# Patient Record
Sex: Male | Born: 1960 | Race: White | Hispanic: No | Marital: Married | Smoking: Never smoker
Health system: Southern US, Community
[De-identification: ages and names within clinical notes are randomized; demographics above are authoritative.]

## PROBLEM LIST (undated history)

## (undated) DIAGNOSIS — M1990 Unspecified osteoarthritis, unspecified site: Secondary | ICD-10-CM

## (undated) DIAGNOSIS — R011 Cardiac murmur, unspecified: Secondary | ICD-10-CM

## (undated) DIAGNOSIS — I10 Essential (primary) hypertension: Secondary | ICD-10-CM

## (undated) HISTORY — DX: Cardiac murmur, unspecified: R01.1

## (undated) HISTORY — PX: APPENDECTOMY: SHX54

## (undated) HISTORY — DX: Unspecified osteoarthritis, unspecified site: M19.90

## (undated) HISTORY — PX: TONSILLECTOMY: SUR1361

## (undated) HISTORY — DX: Essential (primary) hypertension: I10

---

## 2004-08-03 ENCOUNTER — Emergency Department (HOSPITAL_COMMUNITY): Admission: EM | Admit: 2004-08-03 | Discharge: 2004-08-03 | Payer: Self-pay | Admitting: Family Medicine

## 2014-03-11 ENCOUNTER — Ambulatory Visit (INDEPENDENT_AMBULATORY_CARE_PROVIDER_SITE_OTHER): Payer: Managed Care, Other (non HMO) | Admitting: Sports Medicine

## 2014-03-11 ENCOUNTER — Ambulatory Visit (INDEPENDENT_AMBULATORY_CARE_PROVIDER_SITE_OTHER): Payer: Managed Care, Other (non HMO)

## 2014-03-11 VITALS — BP 138/82 | HR 79 | Temp 98.4°F | Resp 16 | Ht 74.5 in | Wt 251.0 lb

## 2014-03-11 DIAGNOSIS — M545 Low back pain, unspecified: Secondary | ICD-10-CM

## 2014-03-11 MED ORDER — TRAMADOL HCL 50 MG PO TABS: 50.0000 mg | ORAL_TABLET | Freq: Two times a day (BID) | ORAL | Status: DC | PRN

## 2014-03-11 MED ORDER — CYCLOBENZAPRINE HCL 5 MG PO TABS: 5.0000 mg | ORAL_TABLET | Freq: Every evening | ORAL | Status: DC | PRN

## 2014-03-11 MED ORDER — PREDNISONE 10 MG PO TABS: ORAL_TABLET | ORAL | Status: DC

## 2014-03-11 NOTE — Progress Notes (Signed)
Gabriel Carter - 54 y.o. male MRN [id number]  Date of birth: [birth year]  SUBJECTIVE:  Including CC & ROS.  Patient is a 54 year old gentleman presented today with low back pain past 2448 hrs. Patient denies any specific injury or trauma to his back. He reports that yesterday he was bending down to pick something off nothing particularly heavy he found a twinge in his back. That night he developed some severe back pain making it difficult to sleep. Describes a generalized soreness and muscle spasm across the back. Occasional sharp twinges with specific motions. No complaints of numbness or tingling in lower extremity, no pain in the groin or lateral hips. No changes in bladder or bowel function. Patient attempted to treat his pain with over-the-counter medication such as Tylenol and a multi drug arthritis medication over-the-counter. Patient had no significant relief from this. Did not try any heat ice or stretching.*   ROS:  Constitutional:  No fever, chills, or fatigue.  Respiratory:  No shortness of breath, cough, or wheezing Cardiovascular:  No palpitations, chest pain or syncope Gastrointestinal:  No nausea, no abdominal pain Review of systems otherwise negative except for what is stated in HPI  HISTORY: Past Medical, Surgical, Social, and Family History Reviewed & Updated per EMR. 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  PHYSICAL EXAM:  VS: BP:138/82 mmHg  HR:79bpm  TEMP:98.4 F (36.9 C)(Oral)  RESP:97 %  HT:6' 2.5" (189.2 cm)   WT:251 lb (113.853 kg)  BMI:31.9 LOW BACK EXAM: General: well nourished Skin of LE: warm; dry, no rashes, lesions, ecchymosis or erythema. Vascular: radial pulses 2+ bilaterally Neurologically: Sensation to light touch lower extremities  equal and intact bilaterally.  Observation: Normal curvature and no kyphosis or lordosis, no scoliosis.  Iliac crests are symmetric, shoulders line symmetrically Palpation:  No step off defects noted in the thoracic or lumbar spine.   No muscle spasm or tenderness along the paraspinal musculature of the thoracic Mild muscle spasm and tenderness along the paraspinal musculature of the lumbar spine. Range of motion:  Normal flexion on forward bending, no pain with extension, no pain with one leg hyperextension. Neuromuscular: No pain with straight leg raise left or right, normal gait walking with walking on heels or walking on toes, normal tandem gait.  Nerve root intervention  L2 and L3: Normal hip flexion with no weakness. L2, L3, L4: Normal hip abduction bilaterally.  Normal patellar DTR +3 bilaterally L4, L5, S1: Normal hip abduction bilaterally S1 and S2: Normal ankle plantar-flexion bilaterally.  Normal Achilles tendon DTR +3 bilaterally L5: Normal extensor hallucis longus bilaterally   [facility] reading (PRIMARY) by Dr. Tammy Sours 3 view of the lumbar spine: Mild degenerative changes, no increased lordosis, no listhesis, normal disc height and space   ASSESSMENT & PLAN:  Impression: Bilateral low back pain without sciatica likely related to muscle spasm  Recommendations: Advised patient that based on his clinical history and examination today  suspect majority of symptoms are related to either mild arthritis in his back versus lower lumbar muscle spasm. Will evaluate patient with x-rays today that was unremarkable for any significant degenerative changes or DDD. Recommended that based on his acute duration of symptoms and lack of symptoms of sciatica I suspect we can treat this with anti-inflammatories. Provided patient prescription with prednisone taper, short course of muscle  relaxer season night for rest, and a short course of pain medication if needed for severe pain tramadol  q12hrs 15  tablets. Patient verbalized understanding of this plan.

## 2018-12-16 ENCOUNTER — Other Ambulatory Visit: Payer: Self-pay | Admitting: Family Medicine

## 2018-12-16 ENCOUNTER — Ambulatory Visit
Admission: RE | Admit: 2018-12-16 | Discharge: 2018-12-16 | Disposition: A | Discharge status: Home / Self Care | Payer: No Typology Code available for payment source | Source: Ambulatory Visit | Attending: Family Medicine | Admitting: Family Medicine

## 2018-12-16 DIAGNOSIS — R0600 Dyspnea, unspecified: Secondary | ICD-10-CM

## 2018-12-24 ENCOUNTER — Encounter: Payer: Self-pay | Admitting: Cardiology

## 2018-12-24 ENCOUNTER — Ambulatory Visit: Payer: No Typology Code available for payment source | Admitting: Cardiology

## 2018-12-24 ENCOUNTER — Other Ambulatory Visit: Payer: Self-pay

## 2018-12-24 VITALS — BP 169/97 | HR 73 | Temp 98.7°F | Ht 75.0 in | Wt 265.6 lb

## 2018-12-24 DIAGNOSIS — Z8249 Family history of ischemic heart disease and other diseases of the circulatory system: Secondary | ICD-10-CM | POA: Diagnosis not present

## 2018-12-24 DIAGNOSIS — Z131 Encounter for screening for diabetes mellitus: Secondary | ICD-10-CM | POA: Diagnosis not present

## 2018-12-24 DIAGNOSIS — Z13220 Encounter for screening for lipoid disorders: Secondary | ICD-10-CM

## 2018-12-24 DIAGNOSIS — I447 Left bundle-branch block, unspecified: Secondary | ICD-10-CM

## 2018-12-24 DIAGNOSIS — R600 Localized edema: Secondary | ICD-10-CM | POA: Diagnosis not present

## 2018-12-24 DIAGNOSIS — R0609 Other forms of dyspnea: Secondary | ICD-10-CM

## 2018-12-24 DIAGNOSIS — R0600 Dyspnea, unspecified: Secondary | ICD-10-CM | POA: Insufficient documentation

## 2018-12-24 DIAGNOSIS — R030 Elevated blood-pressure reading, without diagnosis of hypertension: Secondary | ICD-10-CM | POA: Insufficient documentation

## 2018-12-24 MED ORDER — SPIRONOLACTONE 25 MG PO TABS: 25.0000 mg | ORAL_TABLET | Freq: Every day | ORAL | 3 refills | Status: DC

## 2018-12-24 NOTE — Progress Notes (Signed)
Patient referred by [name], L.Marlou Sa, MD for exertional dyspnea  Subjective:   Gabriel Carter, male    DOB: 1960-10-23, 58 y.o.   MRN: [id number]   Chief Complaint  Patient presents with  . New Patient (Initial Visit)  . Shortness of Breath  . Abnormal ECG     HPI  58 y.o. 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 He stays busy with work, but does not do any regular physical exercise. For last few months, he has noticed exertional dyspnea with climbing up a flight or two of stairs. He also endorses occasional episodes of orthopnea and leg edema. He denies any chest pain. Blood pressure is elevated today. He reports that BP always reads higher at doctors visits, but runs 130s/80s at home.   He has family h/o early CAD. His father had first heart attack in his 14s. He denies any family h/o congestive heart failure.    Past Medical History:  Diagnosis Date  . Arthritis   . Heart murmur      Past Surgical History:  Procedure Laterality Date  . APPENDECTOMY       Social History   Tobacco Use  Smoking Status Never Smoker  Smokeless Tobacco Never Used    Social History   Substance and Sexual Activity  Alcohol Use No  . Alcohol/week: 0.0 standard drinks     Family History  Problem Relation Age of Onset  . Hypertension Mother   . Diabetes Father   . Heart disease Father   . Cancer Maternal Grandmother   . Hypertension Maternal Grandmother   . Heart disease Maternal Grandfather   . Stroke Maternal Grandfather   . Hypertension Paternal Grandmother   . Cancer Paternal Grandfather      Current Outpatient Medications on File Prior to Visit  Medication Sig Dispense Refill  . aspirin EC 81 MG tablet Take 81 mg by mouth. 1 q 3days    . montelukast (SINGULAIR) 10 MG tablet Take 10 mg by mouth daily as needed.     No current facility-administered medications on file prior to visit.    Cardiovascular and other  pertinent studies:  EKG 12/24/2018: Sinus rhythm 69 bpm. Left bundle branch block.  Left atrial enlargement.    Recent labs: 12/16/2018: Glucose 111, BUN/Cr 13/0.95. EGFR 81. Na/K 139/4.1. Rest of the CMP normal H/H 15/43. MCV 95. Platelets 249    Review of Systems  Constitution: Negative for decreased appetite, malaise/fatigue, weight gain and weight loss.  HENT: Negative for congestion.   Eyes: Negative for visual disturbance.  Cardiovascular: Positive for dyspnea on exertion and leg swelling. Negative for chest pain, palpitations and syncope.  Respiratory: Negative for cough.   Endocrine: Negative for cold intolerance.  Hematologic/Lymphatic: Does not bruise/bleed easily.  Skin: Negative for itching and rash.  Musculoskeletal: Negative for myalgias.  Gastrointestinal: Negative for abdominal pain, nausea and vomiting.  Genitourinary: Negative for dysuria.  Neurological: Negative for dizziness and weakness.  Psychiatric/Behavioral: The patient is not nervous/anxious.   All other systems reviewed and are negative.        Vitals:   12/24/18 1043 12/24/18 1054  BP: (!) 164/93 (!) 169/97  Pulse: 69 73  Temp: 98.7 F (37.1 C)   SpO2: 98% 98%     Body mass index is 33.2 kg/m. Filed Weights   12/24/18 1043  Weight: 265 lb 9.6 oz (120.5 kg)     Objective:   Physical Exam  Constitutional: He  is oriented to person, place, and time. He appears well-developed and well-nourished. No distress.  HENT:  Head: Normocephalic and atraumatic.  Eyes: Pupils are equal, round, and reactive to light. Conjunctivae are normal.  Neck: No JVD present.  Cardiovascular: Normal rate, regular rhythm and intact distal pulses.  No murmur heard. Pulmonary/Chest: Effort normal and breath sounds normal. He has no wheezes. He has no rales.  Abdominal: Soft. Bowel sounds are normal. There is no rebound.  Musculoskeletal:        General: Edema (2+ b/l) present.  Lymphadenopathy:    He has  no cervical adenopathy.  Neurological: He is alert and oriented to person, place, and time. No cranial nerve deficit.  Skin: Skin is warm and dry.  Psychiatric: He has a normal mood and affect.  Nursing note and vitals reviewed.       Assessment & Recommendations:   58 y.o. Caucasian male with exertional dyspnea, LBBB, elevated blood pressure, family h/o early CAD.   Exertional dyspnea, leg edema: EKG shows LBBB. History positive for exertional dyspnea, orthopnea. Physical exam positive for leg edema. Suspect he may have systolic/diastolic heart failure. Other differentials include CAD, undiagnosed hypertension. Risk factors for CAD include family h/o early CAD. Will obtain echocardiogram and lexiscan nuclear stress test. Started spironolactone 25 mg daily, both for leg edema and elevated blood pressure. Will check BMP, BNP, TSH, A1C in 1 week.   Elevated blood pressure reading without diagnosis of hypertension Started spironolactone. Monitor blood pressure.     Thank you for referring the patient to [place]. Please feel free to contact with any questions.  Nigel Mormon, MD [facility] Pager: [phone] Office: [phone]

## 2019-01-01 ENCOUNTER — Other Ambulatory Visit: Payer: Self-pay

## 2019-01-01 ENCOUNTER — Ambulatory Visit (INDEPENDENT_AMBULATORY_CARE_PROVIDER_SITE_OTHER): Payer: No Typology Code available for payment source

## 2019-01-01 DIAGNOSIS — R0609 Other forms of dyspnea: Secondary | ICD-10-CM

## 2019-01-01 DIAGNOSIS — Z8249 Family history of ischemic heart disease and other diseases of the circulatory system: Secondary | ICD-10-CM | POA: Diagnosis not present

## 2019-01-01 DIAGNOSIS — R0600 Dyspnea, unspecified: Secondary | ICD-10-CM

## 2019-01-07 LAB — TSH: TSH: 0.936 u[IU]/mL (ref 0.450–4.500)

## 2019-01-07 LAB — BRAIN NATRIURETIC PEPTIDE: BNP: 20.6 pg/mL (ref 0.0–100.0)

## 2019-01-07 LAB — HEMOGLOBIN A1C
Est. average glucose Bld gHb Est-mCnc: 128 mg/dL
Hgb A1c MFr Bld: 6.1 % — ABNORMAL HIGH (ref 4.8–5.6)

## 2019-01-08 ENCOUNTER — Other Ambulatory Visit: Payer: Self-pay

## 2019-01-08 ENCOUNTER — Ambulatory Visit (INDEPENDENT_AMBULATORY_CARE_PROVIDER_SITE_OTHER): Payer: No Typology Code available for payment source

## 2019-01-08 DIAGNOSIS — R0609 Other forms of dyspnea: Secondary | ICD-10-CM | POA: Diagnosis not present

## 2019-01-08 DIAGNOSIS — R0600 Dyspnea, unspecified: Secondary | ICD-10-CM

## 2019-01-16 ENCOUNTER — Other Ambulatory Visit: Payer: Self-pay

## 2019-01-16 ENCOUNTER — Encounter: Payer: Self-pay | Admitting: Cardiology

## 2019-01-16 ENCOUNTER — Ambulatory Visit (INDEPENDENT_AMBULATORY_CARE_PROVIDER_SITE_OTHER): Payer: No Typology Code available for payment source | Admitting: Cardiology

## 2019-01-16 VITALS — BP 154/92 | HR 96 | Temp 98.5°F | Resp 14 | Ht 75.0 in | Wt 263.2 lb

## 2019-01-16 DIAGNOSIS — I5020 Unspecified systolic (congestive) heart failure: Secondary | ICD-10-CM | POA: Diagnosis not present

## 2019-01-16 DIAGNOSIS — I447 Left bundle-branch block, unspecified: Secondary | ICD-10-CM

## 2019-01-16 DIAGNOSIS — I10 Essential (primary) hypertension: Secondary | ICD-10-CM | POA: Diagnosis not present

## 2019-01-16 MED ORDER — ENTRESTO 49-51 MG PO TABS: 1.0000 | ORAL_TABLET | Freq: Two times a day (BID) | ORAL | 2 refills | Status: DC

## 2019-01-16 NOTE — Progress Notes (Signed)
Patient referred by [name], L.Marlou Sa, MD for exertional dyspnea  Subjective:   Gabriel Carter, male    DOB: 1960-02-20, 59 y.o.   MRN: [id number]   Chief Complaint  Patient presents with  . Results    Echocardiogram, Stress, Labs     HPI  59 y.o. Caucasian male with exertional dyspnea, LBBB, elevated blood pressure, family h/o early CAD.   Workup showed mod LVH, apical thinning dyskinesia concerning for old infarct, EF 20-25%. Lexiscan stress test showed  stress LVEF 33%. SPECT imaging showed small sized, mild intensity, mildly reversible, apical perfusion defect. High risk study.   Patient continues to have exertional dyspnea and bilateral leg swelling. He denies any chest pain. Blood pressure remains elevated.   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 For last few months, he has noticed exertional dyspnea with climbing up a flight or two of stairs. He also endorses occasional episodes of orthopnea and leg edema. He denies any chest pain. Blood pressure is elevated today. He reports that BP always reads higher at doctors visits, but runs 130s/80s at home. He has family h/o early CAD. His father had first heart attack in his 29s. He denies any family h/o congestive heart failure.     Current Outpatient Medications on File Prior to Visit  Medication Sig Dispense Refill  . aspirin EC 81 MG tablet Take 81 mg by mouth. 1 q 3days    . montelukast (SINGULAIR) 10 MG tablet Take 10 mg by mouth daily as needed.    Marland Kitchen spironolactone (ALDACTONE) 25 MG tablet Take 1 tablet (25 mg total) by mouth daily. 30 tablet 3   No current facility-administered medications on file prior to visit.    Cardiovascular and other pertinent studies:  Echocardiogram 01/08/2019: Left ventricle cavity is normal in size. Moderate concentric hypertrophy of the left ventricle. Abnormal septal wall motion due to  LBBB. In addition, apical thinning and dyskinesis concerning for old infarct.  Severely depressed LV systolic function with EF 20-25% Indeterminate diastolic function.   Left atrial cavity is mildly dilated. Mild (Grade I) mitral regurgitation. Normal right atrial pressure.   Lexiscan Sestamibi stress test 01/01/2019: No previous exam available for comparison. Lexiscan nuclear stress test performed using 1-day protocol. Dilated LV cavity with severe global reduction in myocardial wall motion and thickening. Stress LVEF 33%. SPECT imaging shows small sized, mild intensity, mildly reversible, apical perfusion defect.  High risk study.   EKG 12/24/2018: Sinus rhythm 69 bpm. Left bundle branch block.  Left atrial enlargement.    Recent labs: 12/16/2018: Glucose 111, BUN/Cr 13/0.95. EGFR 81. Na/K 139/4.1. Rest of the CMP normal H/H 15/43. MCV 95. Platelets 249    Review of Systems  Cardiovascular: Positive for dyspnea on exertion and leg swelling. Negative for chest pain, palpitations and syncope.         Vitals:   01/16/19 1541  BP: (!) 154/92  Pulse: 96  Resp: 14  Temp: 98.5 F (36.9 C)  SpO2: 98%     Body mass index is 32.9 kg/m. Filed Weights   01/16/19 1541  Weight: 263 lb 3.2 oz (119.4 kg)     Objective:   Physical Exam  Constitutional: He appears well-developed and well-nourished.  Neck: No JVD present.  Cardiovascular: Normal rate, regular rhythm, normal heart sounds and intact distal pulses.  No murmur heard. Pulmonary/Chest: Effort normal and breath sounds normal. He has  no wheezes. He has no rales.  Musculoskeletal:        General: Edema (1+ b/l) present.  Nursing note and vitals reviewed.       Assessment & Recommendations:   59 y.o. Caucasian male with exertional dyspnea, LBBB, elevated blood pressure, family h/o early CAD.   Acute systolic heart failure: While BNP was surprisingly normal, I think this was false negative. Echocardiogram and  stress test suggest HFrEF, possibly ischemic cardiomyopathy.  Will obtain cardiac MRI to look for any patterns of late Gadolinium enhancement.  Continue Spironolactone 25 mg daily. Started Entresto 49-51 mg bid. BMP in 1 week. Once better compensated, will perform coronary angiography, right and left heart catheterization.  Hypertension: As above.   Nigel Mormon, MD [facility] Pager: [phone] Office: [phone]

## 2019-01-21 ENCOUNTER — Telehealth: Payer: Self-pay

## 2019-01-24 ENCOUNTER — Telehealth: Payer: Self-pay | Admitting: *Deleted

## 2019-01-24 NOTE — Telephone Encounter (Signed)
Spoke with patient regarding appointment for Cardiac MRI scheduled Friday 02/07/19 at 8:00 am at [facility]---arrival time is 7:15 am 1st floor admissions office---pt states he saw the message on  My Chart

## 2019-01-25 LAB — BASIC METABOLIC PANEL
BUN/Creatinine Ratio: 15 (ref 9–20)
BUN: 14 mg/dL (ref 6–24)
CO2: 24 mmol/L (ref 20–29)
Calcium: 9.1 mg/dL (ref 8.7–10.2)
Chloride: 105 mmol/L (ref 96–106)
Creatinine, Ser: 0.95 mg/dL (ref 0.76–1.27)
GFR calc Af Amer: 101 mL/min/{1.73_m2} (ref >59)
GFR calc non Af Amer: 87 mL/min/{1.73_m2} (ref >59)
Glucose: 112 mg/dL — ABNORMAL HIGH (ref 65–99)
Potassium: 4.2 mmol/L (ref 3.5–5.2)
Sodium: 143 mmol/L (ref 134–144)

## 2019-01-30 ENCOUNTER — Ambulatory Visit: Payer: No Typology Code available for payment source | Admitting: Cardiology

## 2019-01-30 ENCOUNTER — Encounter: Payer: Self-pay | Admitting: Cardiology

## 2019-01-30 ENCOUNTER — Other Ambulatory Visit: Payer: Self-pay

## 2019-01-30 VITALS — BP 154/89 | HR 81 | Temp 98.2°F | Ht 75.0 in | Wt 264.4 lb

## 2019-01-30 DIAGNOSIS — I447 Left bundle-branch block, unspecified: Secondary | ICD-10-CM

## 2019-01-30 DIAGNOSIS — I10 Essential (primary) hypertension: Secondary | ICD-10-CM | POA: Diagnosis not present

## 2019-01-30 DIAGNOSIS — I5020 Unspecified systolic (congestive) heart failure: Secondary | ICD-10-CM

## 2019-01-30 MED ORDER — METOPROLOL SUCCINATE ER 50 MG PO TB24: 50.0000 mg | ORAL_TABLET | Freq: Every day | ORAL | 3 refills | Status: DC

## 2019-01-30 NOTE — H&P (View-Only) (Signed)
Patient referred by [name], GabrielMarlou Sa, MD for exertional dyspnea  Subjective:   Gabriel Carter, male    DOB: [birth year], 59 y.o.   MRN: [id number]   Chief Complaint  Patient presents with  . HFrEF  . Follow-up    2 week     HPI  59 y.o. Caucasian male with exertional dyspnea, LBBB, elevated blood pressure, family h/o early CAD.   Workup showed mod LVH, apical thinning dyskinesia concerning for old infarct, EF 20-25%. Lexiscan stress test showed  stress LVEF 33%. SPECT imaging showed small sized, mild intensity, mildly reversible, apical perfusion defect. High risk study.   Patient continues to have exertional dyspnea and bilateral leg swelling.  His blood pressure initially improved after starting spironolactone, but his trending higher again.  He is making changes to his diet to reduce salt intake.  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 For last few months, he has noticed exertional dyspnea with climbing up a flight or two of stairs. He also endorses occasional episodes of orthopnea and leg edema. He denies any chest pain. Blood pressure is elevated today. He reports that BP always reads higher at doctors visits, but runs 130s/80s at home. He has family h/o early CAD. His father had first heart attack in his 32s. He denies any family h/o congestive heart failure.     Current Outpatient Medications on File Prior to Visit  Medication Sig Dispense Refill  . aspirin EC 81 MG tablet Take 81 mg by mouth. 1 q 3days    . montelukast (SINGULAIR) 10 MG tablet Take 10 mg by mouth daily as needed.    . sacubitril-valsartan (ENTRESTO) 49-51 MG Take 1 tablet by mouth 2 (two) times daily. 60 tablet 2  . spironolactone (ALDACTONE) 25 MG tablet Take 1 tablet (25 mg total) by mouth daily. 30 tablet 3   No current facility-administered medications on file prior to visit.    Cardiovascular  and other pertinent studies:  Echocardiogram 01/08/2019: Left ventricle cavity is normal in size. Moderate concentric hypertrophy of the left ventricle. Abnormal septal wall motion due to LBBB. In addition, apical thinning and dyskinesis concerning for old infarct.  Severely depressed LV systolic function with EF 20-25% Indeterminate diastolic function.   Left atrial cavity is mildly dilated. Mild (Grade I) mitral regurgitation. Normal right atrial pressure.   Lexiscan Sestamibi stress test 01/01/2019: No previous exam available for comparison. Lexiscan nuclear stress test performed using 1-day protocol. Dilated LV cavity with severe global reduction in myocardial wall motion and thickening. Stress LVEF 33%. SPECT imaging shows small sized, mild intensity, mildly reversible, apical perfusion defect.  High risk study.   EKG 12/24/2018: Sinus rhythm 69 bpm. Left bundle branch block.  Left atrial enlargement.    Recent labs: 12/16/2018: Glucose 111, BUN/Cr 13/0.95. EGFR 81. Na/K 139/4.1. Rest of the CMP normal H/H 15/43. MCV 95. Platelets 249    Review of Systems  Cardiovascular: Positive for dyspnea on exertion and leg swelling. Negative for chest pain, palpitations and syncope.         Vitals:   01/30/19 1620  BP: (!) 154/89  Pulse: 81  Temp: 98.2 F (36.8 C)  SpO2: 98%     Body mass index is 33.05 kg/m. Filed Weights   01/30/19 1620  Weight: 264 lb 6.4 oz (119.9 kg)     Objective:   Physical Exam  Constitutional: He appears well-developed  and well-nourished.  Neck: No JVD present.  Cardiovascular: Normal rate, regular rhythm, normal heart sounds and intact distal pulses.  No murmur heard. Pulmonary/Chest: Effort normal and breath sounds normal. He has no wheezes. He has no rales.  Musculoskeletal:        General: Edema (1+ b/l) present.  Nursing note and vitals reviewed.       Assessment & Recommendations:   58 y.o. Caucasian male with exertional  dyspnea, LBBB, elevated blood pressure, family h/o early CAD.   Acute on chronic systolic heart failure: While BNP was surprisingly normal, I think this was false negative. Echocardiogram and stress test suggest HFrEF, possibly ischemic cardiomyopathy.  Apical thinning and dyskinesis is concerning for old infarct.  I recommended cardiac MRI to look for any patterns of late Gadolinium enhancement as well as viability.  This is tending and scheduled for 02/07/2019.  I will plan on performing right and left heart catheterization on 02/11/2019. Continue Spironolactone 25 mg daily. Started Entresto 49-51 mg bid. Added metoprolol succinate 50 mg daily.  BMP next week.  Hypertension: Not controlled. Management as above.   Manish J Patwardhan, MD [facility] Pager: [phone] Office: [phone] 

## 2019-01-30 NOTE — Progress Notes (Signed)
  Patient referred by Mitchell, L.Dean, MD for exertional dyspnea  Subjective:   Gabriel Carter, male    DOB: 03/12/1960, 59 y.o.   MRN: [id number]   Chief Complaint  Patient presents with  . HFrEF  . Follow-up    2 week     HPI  59 y.o. Caucasian male with exertional dyspnea, LBBB, elevated blood pressure, family h/o early CAD.   Workup showed mod LVH, apical thinning dyskinesia concerning for old infarct, EF 20-25%. Lexiscan stress test showed  stress LVEF 33%. SPECT imaging showed small sized, mild intensity, mildly reversible, apical perfusion defect. High risk study.   Patient continues to have exertional dyspnea and bilateral leg swelling.  His blood pressure initially improved after starting spironolactone, but his trending higher again.  He is making changes to his diet to reduce salt intake.  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 For last few months, he has noticed exertional dyspnea with climbing up a flight or two of stairs. He also endorses occasional episodes of orthopnea and leg edema. He denies any chest pain. Blood pressure is elevated today. He reports that BP always reads higher at doctors visits, but runs 130s/80s at home. He has family h/o early CAD. His father had first heart attack in his 40s. He denies any family h/o congestive heart failure.     Current Outpatient Medications on File Prior to Visit  Medication Sig Dispense Refill  . aspirin EC 81 MG tablet Take 81 mg by mouth. 1 q 3days    . montelukast (SINGULAIR) 10 MG tablet Take 10 mg by mouth daily as needed.    . sacubitril-valsartan (ENTRESTO) 49-51 MG Take 1 tablet by mouth 2 (two) times daily. 60 tablet 2  . spironolactone (ALDACTONE) 25 MG tablet Take 1 tablet (25 mg total) by mouth daily. 30 tablet 3   No current facility-administered medications on file prior to visit.    Cardiovascular  and other pertinent studies:  Echocardiogram 01/08/2019: Left ventricle cavity is normal in size. Moderate concentric hypertrophy of the left ventricle. Abnormal septal wall motion due to LBBB. In addition, apical thinning and dyskinesis concerning for old infarct.  Severely depressed LV systolic function with EF 20-25% Indeterminate diastolic function.   Left atrial cavity is mildly dilated. Mild (Grade I) mitral regurgitation. Normal right atrial pressure.   Lexiscan Sestamibi stress test 01/01/2019: No previous exam available for comparison. Lexiscan nuclear stress test performed using 1-day protocol. Dilated LV cavity with severe global reduction in myocardial wall motion and thickening. Stress LVEF 33%. SPECT imaging shows small sized, mild intensity, mildly reversible, apical perfusion defect.  High risk study.   EKG 12/24/2018: Sinus rhythm 69 bpm. Left bundle branch block.  Left atrial enlargement.    Recent labs: 12/16/2018: Glucose 111, BUN/Cr 13/0.95. EGFR 81. Na/K 139/4.1. Rest of the CMP normal H/H 15/43. MCV 95. Platelets 249    Review of Systems  Cardiovascular: Positive for dyspnea on exertion and leg swelling. Negative for chest pain, palpitations and syncope.         Vitals:   01/30/19 1620  BP: (!) 154/89  Pulse: 81  Temp: 98.2 F (36.8 C)  SpO2: 98%     Body mass index is 33.05 kg/m. Filed Weights   01/30/19 1620  Weight: 264 lb 6.4 oz (119.9 kg)     Objective:   Physical Exam  Constitutional: He appears well-developed   and well-nourished.  Neck: No JVD present.  Cardiovascular: Normal rate, regular rhythm, normal heart sounds and intact distal pulses.  No murmur heard. Pulmonary/Chest: Effort normal and breath sounds normal. He has no wheezes. He has no rales.  Musculoskeletal:        General: Edema (1+ b/l) present.  Nursing note and vitals reviewed.       Assessment & Recommendations:   59 y.o. Caucasian male with exertional  dyspnea, LBBB, elevated blood pressure, family h/o early CAD.   Acute on chronic systolic heart failure: While BNP was surprisingly normal, I think this was false negative. Echocardiogram and stress test suggest HFrEF, possibly ischemic cardiomyopathy.  Apical thinning and dyskinesis is concerning for old infarct.  I recommended cardiac MRI to look for any patterns of late Gadolinium enhancement as well as viability.  This is tending and scheduled for 02/07/2019.  I will plan on performing right and left heart catheterization on 02/11/2019. Continue Spironolactone 25 mg daily. Started Entresto 49-51 mg bid. Added metoprolol succinate 50 mg daily.  BMP next week.  Hypertension: Not controlled. Management as above.   Arzella Rehmann J Shelonda Saxe, MD [facility] Pager: [phone] Office: [phone] 

## 2019-01-31 ENCOUNTER — Encounter: Payer: Self-pay | Admitting: Cardiology

## 2019-01-31 DIAGNOSIS — I10 Essential (primary) hypertension: Secondary | ICD-10-CM | POA: Insufficient documentation

## 2019-02-06 ENCOUNTER — Telehealth (HOSPITAL_COMMUNITY): Payer: Self-pay | Admitting: Emergency Medicine

## 2019-02-06 ENCOUNTER — Encounter (HOSPITAL_COMMUNITY): Payer: Self-pay

## 2019-02-06 NOTE — Telephone Encounter (Signed)
Left message on voicemail with name and callback number Zoi Devine RN Navigator Cardiac Imaging [facility] [phone] Office [phone] Cell  

## 2019-02-07 ENCOUNTER — Other Ambulatory Visit (HOSPITAL_COMMUNITY)
Admission: RE | Admit: 2019-02-07 | Discharge: 2019-02-07 | Disposition: A | Discharge status: Home / Self Care | Payer: No Typology Code available for payment source | Source: Ambulatory Visit | Attending: Cardiology | Admitting: Cardiology

## 2019-02-07 ENCOUNTER — Other Ambulatory Visit (HOSPITAL_COMMUNITY): Payer: Self-pay | Admitting: Cardiology

## 2019-02-07 ENCOUNTER — Ambulatory Visit (HOSPITAL_COMMUNITY)
Admission: RE | Admit: 2019-02-07 | Discharge: 2019-02-07 | Disposition: A | Discharge status: Home / Self Care | Payer: No Typology Code available for payment source | Source: Ambulatory Visit | Attending: Cardiology | Admitting: Cardiology

## 2019-02-07 ENCOUNTER — Other Ambulatory Visit: Payer: Self-pay

## 2019-02-07 DIAGNOSIS — I5020 Unspecified systolic (congestive) heart failure: Secondary | ICD-10-CM | POA: Insufficient documentation

## 2019-02-07 DIAGNOSIS — Z01812 Encounter for preprocedural laboratory examination: Secondary | ICD-10-CM | POA: Insufficient documentation

## 2019-02-07 DIAGNOSIS — Z20822 Contact with and (suspected) exposure to covid-19: Secondary | ICD-10-CM | POA: Insufficient documentation

## 2019-02-07 DIAGNOSIS — I081 Rheumatic disorders of both mitral and tricuspid valves: Secondary | ICD-10-CM | POA: Diagnosis not present

## 2019-02-07 LAB — SARS CORONAVIRUS 2 (TAT 6-24 HRS): SARS Coronavirus 2: NEGATIVE

## 2019-02-07 MED ORDER — GADOBUTROL 1 MMOL/ML IV SOLN
14.0000 mL | Freq: Once | INTRAVENOUS | Status: AC | PRN
  Administered 2019-02-07: 14 mL via INTRAVENOUS

## 2019-02-08 LAB — BASIC METABOLIC PANEL
BUN/Creatinine Ratio: 15 (ref 9–20)
BUN: 15 mg/dL (ref 6–24)
CO2: 23 mmol/L (ref 20–29)
Calcium: 9.8 mg/dL (ref 8.7–10.2)
Chloride: 104 mmol/L (ref 96–106)
Creatinine, Ser: 1.01 mg/dL (ref 0.76–1.27)
GFR calc Af Amer: 94 mL/min/{1.73_m2} (ref >59)
GFR calc non Af Amer: 81 mL/min/{1.73_m2} (ref >59)
Glucose: 102 mg/dL — ABNORMAL HIGH (ref 65–99)
Potassium: 5.3 mmol/L — ABNORMAL HIGH (ref 3.5–5.2)
Sodium: 143 mmol/L (ref 134–144)

## 2019-02-08 LAB — LIPID PANEL
Chol/HDL Ratio: 3.2 ratio (ref 0.0–5.0)
Cholesterol, Total: 171 mg/dL (ref 100–199)
HDL: 53 mg/dL (ref >39)
LDL Chol Calc (NIH): 101 mg/dL — ABNORMAL HIGH (ref 0–99)
Triglycerides: 93 mg/dL (ref 0–149)
VLDL Cholesterol Cal: 17 mg/dL (ref 5–40)

## 2019-02-11 ENCOUNTER — Ambulatory Visit (HOSPITAL_COMMUNITY)
Admission: RE | Admit: 2019-02-11 | Discharge: 2019-02-11 | Disposition: A | Discharge status: Home / Self Care | Payer: No Typology Code available for payment source | Attending: Cardiology | Admitting: Cardiology

## 2019-02-11 ENCOUNTER — Encounter (HOSPITAL_COMMUNITY)
Admission: RE | Disposition: A | Discharge status: Home / Self Care | Payer: Self-pay | Source: Home / Self Care | Attending: Cardiology

## 2019-02-11 ENCOUNTER — Other Ambulatory Visit: Payer: Self-pay

## 2019-02-11 DIAGNOSIS — Z79899 Other long term (current) drug therapy: Secondary | ICD-10-CM | POA: Diagnosis not present

## 2019-02-11 DIAGNOSIS — M7989 Other specified soft tissue disorders: Secondary | ICD-10-CM | POA: Insufficient documentation

## 2019-02-11 DIAGNOSIS — I447 Left bundle-branch block, unspecified: Secondary | ICD-10-CM | POA: Diagnosis not present

## 2019-02-11 DIAGNOSIS — Z7982 Long term (current) use of aspirin: Secondary | ICD-10-CM | POA: Diagnosis not present

## 2019-02-11 DIAGNOSIS — I110 Hypertensive heart disease with heart failure: Secondary | ICD-10-CM | POA: Insufficient documentation

## 2019-02-11 DIAGNOSIS — I428 Other cardiomyopathies: Secondary | ICD-10-CM | POA: Insufficient documentation

## 2019-02-11 DIAGNOSIS — Z8249 Family history of ischemic heart disease and other diseases of the circulatory system: Secondary | ICD-10-CM | POA: Diagnosis not present

## 2019-02-11 DIAGNOSIS — I5020 Unspecified systolic (congestive) heart failure: Secondary | ICD-10-CM | POA: Diagnosis present

## 2019-02-11 DIAGNOSIS — I5023 Acute on chronic systolic (congestive) heart failure: Secondary | ICD-10-CM | POA: Diagnosis present

## 2019-02-11 DIAGNOSIS — R0609 Other forms of dyspnea: Secondary | ICD-10-CM | POA: Diagnosis not present

## 2019-02-11 HISTORY — PX: RIGHT/LEFT HEART CATH AND CORONARY ANGIOGRAPHY: CATH118266

## 2019-02-11 LAB — POCT I-STAT 7, (LYTES, BLD GAS, ICA,H+H)
Acid-base deficit: 1 mmol/L (ref 0.0–2.0)
Bicarbonate: 24.1 mmol/L (ref 20.0–28.0)
Calcium, Ion: 1.15 mmol/L (ref 1.15–1.40)
HCT: 40 % (ref 39.0–52.0)
Hemoglobin: 13.6 g/dL (ref 13.0–17.0)
O2 Saturation: 97 %
Potassium: 3.8 mmol/L (ref 3.5–5.1)
Sodium: 141 mmol/L (ref 135–145)
TCO2: 25 mmol/L (ref 22–32)
pCO2 arterial: 39.5 mmHg (ref 32.0–48.0)
pH, Arterial: 7.392 (ref 7.350–7.450)
pO2, Arterial: 92 mmHg (ref 83.0–108.0)

## 2019-02-11 LAB — POCT I-STAT EG7
Bicarbonate: 25 mmol/L (ref 20.0–28.0)
Calcium, Ion: 1.22 mmol/L (ref 1.15–1.40)
HCT: 43 % (ref 39.0–52.0)
Hemoglobin: 14.6 g/dL (ref 13.0–17.0)
O2 Saturation: 76 %
Potassium: 4 mmol/L (ref 3.5–5.1)
Sodium: 140 mmol/L (ref 135–145)
TCO2: 26 mmol/L (ref 22–32)
pCO2, Ven: 43.3 mmHg — ABNORMAL LOW (ref 44.0–60.0)
pH, Ven: 7.37 (ref 7.250–7.430)
pO2, Ven: 42 mmHg (ref 32.0–45.0)

## 2019-02-11 LAB — CBC
HCT: 44.6 % (ref 39.0–52.0)
Hemoglobin: 15.3 g/dL (ref 13.0–17.0)
MCH: 32.6 pg (ref 26.0–34.0)
MCHC: 34.3 g/dL (ref 30.0–36.0)
MCV: 94.9 fL (ref 80.0–100.0)
Platelets: 277 10*3/uL (ref 150–400)
RBC: 4.7 MIL/uL (ref 4.22–5.81)
RDW: 11.6 % (ref 11.5–15.5)
WBC: 8.5 10*3/uL (ref 4.0–10.5)
nRBC: 0 % (ref 0.0–0.2)

## 2019-02-11 SURGERY — RIGHT/LEFT HEART CATH AND CORONARY ANGIOGRAPHY
Anesthesia: LOCAL

## 2019-02-11 MED ORDER — SODIUM CHLORIDE 0.9% FLUSH: 3.0000 mL | INTRAVENOUS | Status: DC | PRN

## 2019-02-11 MED ORDER — SODIUM CHLORIDE 0.9% FLUSH: 3.0000 mL | Freq: Two times a day (BID) | INTRAVENOUS | Status: DC

## 2019-02-11 MED ORDER — LIDOCAINE HCL (PF) 1 % IJ SOLN
INTRAMUSCULAR | Status: DC | PRN
  Administered 2019-02-11 (×2): 2 mL via SUBCUTANEOUS

## 2019-02-11 MED ORDER — FENTANYL CITRATE (PF) 100 MCG/2ML IJ SOLN
INTRAMUSCULAR | Status: DC | PRN
  Administered 2019-02-11: 25 ug via INTRAVENOUS

## 2019-02-11 MED ORDER — MIDAZOLAM HCL 2 MG/2ML IJ SOLN
INTRAMUSCULAR | Status: AC
  Filled 2019-02-11: qty 2

## 2019-02-11 MED ORDER — LABETALOL HCL 5 MG/ML IV SOLN: 10.0000 mg | INTRAVENOUS | Status: DC | PRN

## 2019-02-11 MED ORDER — HEPARIN (PORCINE) IN NACL 1000-0.9 UT/500ML-% IV SOLN
INTRAVENOUS | Status: AC
  Filled 2019-02-11: qty 500

## 2019-02-11 MED ORDER — HYDRALAZINE HCL 20 MG/ML IJ SOLN: 10.0000 mg | INTRAMUSCULAR | Status: DC | PRN

## 2019-02-11 MED ORDER — HEPARIN (PORCINE) IN NACL 1000-0.9 UT/500ML-% IV SOLN
INTRAVENOUS | Status: DC | PRN
  Administered 2019-02-11 (×2): 500 mL

## 2019-02-11 MED ORDER — ASPIRIN 81 MG PO CHEW
81.0000 mg | CHEWABLE_TABLET | ORAL | Status: AC
  Administered 2019-02-11: 12:00:00 81 mg via ORAL
  Filled 2019-02-11: qty 1

## 2019-02-11 MED ORDER — SODIUM CHLORIDE 0.9 % IV SOLN: INTRAVENOUS | Status: AC

## 2019-02-11 MED ORDER — MIDAZOLAM HCL 2 MG/2ML IJ SOLN
INTRAMUSCULAR | Status: DC | PRN
  Administered 2019-02-11: 1 mg via INTRAVENOUS

## 2019-02-11 MED ORDER — HEPARIN SODIUM (PORCINE) 1000 UNIT/ML IJ SOLN
INTRAMUSCULAR | Status: AC
  Filled 2019-02-11: qty 1

## 2019-02-11 MED ORDER — ONDANSETRON HCL 4 MG/2ML IJ SOLN: 4.0000 mg | Freq: Four times a day (QID) | INTRAMUSCULAR | Status: DC | PRN

## 2019-02-11 MED ORDER — SODIUM CHLORIDE 0.9 % IV SOLN: 250.0000 mL | INTRAVENOUS | Status: DC | PRN

## 2019-02-11 MED ORDER — HEPARIN SODIUM (PORCINE) 1000 UNIT/ML IJ SOLN
INTRAMUSCULAR | Status: DC | PRN
  Administered 2019-02-11: 5000 [IU] via INTRAVENOUS

## 2019-02-11 MED ORDER — FENTANYL CITRATE (PF) 100 MCG/2ML IJ SOLN
INTRAMUSCULAR | Status: AC
  Filled 2019-02-11: qty 2

## 2019-02-11 MED ORDER — IOHEXOL 350 MG/ML SOLN
INTRAVENOUS | Status: DC | PRN
  Administered 2019-02-11: 30 mL via INTRA_ARTERIAL

## 2019-02-11 MED ORDER — SODIUM CHLORIDE 0.9 % IV SOLN: INTRAVENOUS | Status: DC

## 2019-02-11 MED ORDER — VERAPAMIL HCL 2.5 MG/ML IV SOLN
INTRAVENOUS | Status: AC
  Filled 2019-02-11: qty 2

## 2019-02-11 MED ORDER — VERAPAMIL HCL 2.5 MG/ML IV SOLN
INTRAVENOUS | Status: DC | PRN
  Administered 2019-02-11: 10 mL via INTRA_ARTERIAL

## 2019-02-11 MED ORDER — LIDOCAINE HCL (PF) 1 % IJ SOLN
INTRAMUSCULAR | Status: AC
  Filled 2019-02-11: qty 30

## 2019-02-11 MED ORDER — ACETAMINOPHEN 325 MG PO TABS: 650.0000 mg | ORAL_TABLET | ORAL | Status: DC | PRN

## 2019-02-11 SURGICAL SUPPLY — 11 items

## 2019-02-11 NOTE — Research (Signed)
PHDE Informed Consent   Subject Name: Gabriel Carter  Subject met inclusion and exclusion criteria.  The informed consent form, study requirements and expectations were reviewed with the subject and questions and concerns were addressed prior to the signing of the consent form.  The subject verbalized understanding of the trail requirements.  The subject agreed to participate in the PHDE  trial and signed the informed consent.  The informed consent was obtained prior to performance of any protocol-specific procedures for the subject.  A copy of the signed informed consent was given to the subject and a copy was placed in the subject's medical record.  [name]. 02/11/2019, 1:15 PM

## 2019-02-11 NOTE — Interval H&P Note (Signed)
History and Physical Interval Note:  02/11/2019 1:08 PM  Gabriel Carter  has presented today for surgery, with the diagnosis of heart failure.  The various methods of treatment have been discussed with the patient and family. After consideration of risks, benefits and other options for treatment, the patient has consented to  Procedure(s): RIGHT/LEFT HEART CATH AND CORONARY ANGIOGRAPHY (N/A) as a surgical intervention.  The patient's history has been reviewed, patient examined, no change in status, stable for surgery.  I have reviewed the patient's chart and labs.  Questions were answered to the patient's satisfaction.    2012 Appropriate Use Criteria for Diagnostic Catheterization Home / Select Test of Interest Indication for RHC Cardiomyopathies Cardiomyopathies (Right and Left Heart Catheterization OR Right Heart Catheterization Alone With/Wit Cardiomyopathies  (Right and Left Heart Catheterization OR  Right Heart Catheterization Alone With/Without Left Ventriculography and Coronary Angiography)  Link Here: [email or web address] Indication:  Known or suspected cardiomyopathy with or without heart failure A (7) Indication: 93; Score 7   Gabriel Carter Gabriel Carter

## 2019-02-11 NOTE — Discharge Instructions (Signed)
Keep right wrist elevated at heart level for 24 hours and drink plenty of fluids  Radial Site Care  This sheet gives you information about how to care for yourself after your procedure. Your health care provider may also give you more specific instructions. If you have problems or questions, contact your health care provider. What can I expect after the procedure? After the procedure, it is common to have:  Bruising and tenderness at the catheter insertion area. Follow these instructions at home: Medicines  Take over-the-counter and prescription medicines only as told by your health care provider. Insertion site care  Follow instructions from your health care provider about how to take care of your insertion site. Make sure you: ? Wash your hands with soap and water before you change your bandage (dressing). If soap and water are not available, use hand sanitizer. ? Remove your dressing as told by your health care provider. In 24-48 hours  Check your insertion site every day for signs of infection. Check for: ? Redness, swelling, or pain. ? Fluid or blood. ? Pus or a bad smell. ? Warmth.  Do not take baths, swim, or use a hot tub until your health care provider approves.  You may shower 24-48 hours after the procedure, or as directed by your health care provider. ? Remove the dressing and gently wash the site with plain soap and water. ? Pat the area dry with a clean towel. ? Do not rub the site. That could cause bleeding.  Do not apply powder or lotion to the site. Activity   For 24 hours after the procedure, or as directed by your health care provider: ? Do not flex or bend the affected arm. ? Do not push or pull heavy objects with the affected arm. ? Do not drive yourself home from the hospital or clinic. You may drive 24 hours after the procedure unless your health care provider tells you not to. ? Do not operate machinery or power tools.  Do not lift anything that is  heavier than 10 lb (4.5 kg), or the limit that you are told, until your health care provider says that it is safe. For 4 days  Ask your health care provider when it is okay to: ? Return to work or school. ? Resume usual physical activities or sports. ? Resume sexual activity. General instructions  If the catheter site starts to bleed, raise your arm and put firm pressure on the site. If the bleeding does not stop, get help right away. This is a medical emergency.  If you went home on the same day as your procedure, a responsible adult should be with you for the first 24 hours after you arrive home.  Keep all follow-up visits as told by your health care provider. This is important. Contact a health care provider if:  You have a fever.  You have redness, swelling, or yellow drainage around your insertion site. Get help right away if:  You have unusual pain at the radial site.  The catheter insertion area swells very fast.  The insertion area is bleeding, and the bleeding does not stop when you hold steady pressure on the area.  Your arm or hand becomes pale, cool, tingly, or numb. These symptoms may represent a serious problem that is an emergency. Do not wait to see if the symptoms will go away. Get medical help right away. Call your local emergency services (911 in the [place] Do not drive yourself to the   hospital. Summary  After the procedure, it is common to have bruising and tenderness at the site.  Follow instructions from your health care provider about how to take care of your radial site wound. Check the wound every day for signs of infection.  Do not lift anything that is heavier than 10 lb (4.5 kg), or the limit that you are told, until your health care provider says that it is safe. This information is not intended to replace advice given to you by your health care provider. Make sure you discuss any questions you have with your health care provider. Document Revised:  01/24/2017 Document Reviewed: 01/24/2017 Elsevier Patient Education  2020 [employer]  

## 2019-02-21 ENCOUNTER — Encounter: Payer: Self-pay | Admitting: Cardiology

## 2019-02-21 ENCOUNTER — Other Ambulatory Visit: Payer: Self-pay

## 2019-02-21 ENCOUNTER — Ambulatory Visit: Payer: No Typology Code available for payment source | Admitting: Cardiology

## 2019-02-21 VITALS — BP 149/79 | HR 68 | Temp 97.6°F | Resp 16 | Ht 74.0 in | Wt 266.0 lb

## 2019-02-21 DIAGNOSIS — I5020 Unspecified systolic (congestive) heart failure: Secondary | ICD-10-CM

## 2019-02-21 DIAGNOSIS — I10 Essential (primary) hypertension: Secondary | ICD-10-CM

## 2019-02-21 DIAGNOSIS — I428 Other cardiomyopathies: Secondary | ICD-10-CM

## 2019-02-21 DIAGNOSIS — I447 Left bundle-branch block, unspecified: Secondary | ICD-10-CM

## 2019-02-21 MED ORDER — FARXIGA 10 MG PO TABS: 10.0000 mg | ORAL_TABLET | Freq: Every day | ORAL | 2 refills | Status: DC

## 2019-02-21 NOTE — Progress Notes (Signed)
Patient referred by Gabriel Carter, L.Marlou Sa, MD for exertional dyspnea  Subjective:   Gabriel Carter, male    DOB: [birth year], 59 y.o.   MRN: [id number]   Chief Complaint  Patient presents with  . Heart failure with reduced with ejection fraction    7-10 day follow up     HPI  59 y.o. Caucasian male with exertional dyspnea, LBBB, elevated blood pressure, family h/o early CAD.   Cardiac MRI and left and right heart catheterization showed nonischemic or myopathy.  Vein pressures were normal on heart catheterization.  His dyspnea on exertion has improved.  He still has swelling in both his legs.  He is compliant with his medical therapy.  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 He stays busy with work, but does not do any regular physical exercise. For last few months, he has noticed exertional dyspnea with climbing up a flight or two of stairs. He also endorses occasional episodes of orthopnea and leg edema. He denies any chest pain. Blood pressure is elevated today. He reports that BP always reads higher at doctors visits, but runs 130s/80s at home. He has family h/o early CAD. His father had first heart attack in his 47s. He denies any family h/o congestive heart failure.     Current Outpatient Medications on File Prior to Visit  Medication Sig Dispense Refill  . aspirin EC 81 MG tablet Take 81 mg by mouth daily.     . metoprolol succinate (TOPROL-XL) 50 MG 24 hr tablet Take 1 tablet (50 mg total) by mouth daily. Take with or immediately following a meal. (Patient taking differently: Take 50 mg by mouth every evening. ) 30 tablet 3  . montelukast (SINGULAIR) 10 MG tablet Take 10 mg by mouth daily as needed (allergies).     . sacubitril-valsartan (ENTRESTO) 49-51 MG Take 1 tablet by mouth 2 (two) times daily. 60 tablet 2  . spironolactone (ALDACTONE) 25 MG tablet Take 1 tablet (25 mg total) by mouth daily. 30 tablet 3   No current facility-administered  medications on file prior to visit.    Cardiovascular and other pertinent studies:  Cardiac MRI 02/07/19: 1. Mildly dilated let ventricle with mild concentric hypertrophy and moderately to severely decreased systolic function (LVEF = 34%). There is pronounced paradoxical septal motion (consistent with LBBB) and hypokinesis of the basal and mid inferior and apical anterior, septal and inferior wall. There is midwall late gadolinium enhancement in the baseline anteroseptal and inferoseptal walls consistent with decompensated CHF/elevated LVEDP. 2. Normal right ventricular size, thickness and systolic function (LVEF = 56%). There are no regional wall motion abnormalities. 3.  Normal left and right atrial size. 4. Mildly dilated pulmonary artery measuring 34 mm suggestive of  pulmonary hypertension. 5.  Mild mitral and tricuspid regurgitation.  Collectively, these findings are suggestive of dilated non-ischemic cardiomyopathy. There is no scarring, no signs of infiltrative of inflammatory cardiomyopathy.   EKG 02/21/2019: Sinus  Rhythm  Left bundle branch block.    RHC/LHC/Coronary angiography 02/11/2019: LM: Normal LAD: Normal LCx: Normal RCA: Normal  RA: 3 mmHg RV: 31/0 mmHg  PA: 21/6 mmHg, mean PA 11 mmHg PCW: 11 mmHg LVEDP 6 mmHg CO: 7.4 L/min CI: 3 L/min/m2  Compensated nonischemic cardiomyopathy  Echocardiogram 01/08/2019: Left ventricle cavity is normal in size. Moderate concentric hypertrophy of the left ventricle. Abnormal septal wall motion due to LBBB. In addition, apical thinning and dyskinesis concerning for old infarct.  Severely depressed LV systolic function with  EF 20-25% Indeterminate diastolic function.   Left atrial cavity is mildly dilated. Mild (Grade I) mitral regurgitation. Normal right atrial pressure.   Lexiscan Sestamibi stress test 01/01/2019: No previous exam available for comparison. Lexiscan nuclear stress test performed using 1-day  protocol. Dilated LV cavity with severe global reduction in myocardial wall motion and thickening. Stress LVEF 33%. SPECT imaging shows small sized, mild intensity, mildly reversible, apical perfusion defect.  High risk study.   EKG 12/24/2018: Sinus rhythm 69 bpm. Left bundle branch block.  Left atrial enlargement.    Recent labs: 02/07/2019: Glucose 101, BUN/Cr 15/1.01. EGFR 81. Na/K 143/5.3.   01/06/2019: HbA1C 6.1% Chol 171, TG 93, HDL 53, LDL 101 TSH 0.9 normal   12/16/2018: Glucose 111, BUN/Cr 13/0.95. EGFR 81. Na/K 139/4.1. Rest of the CMP normal H/H 15/43. MCV 95. Platelets 249    Review of Systems  Cardiovascular: Positive for dyspnea on exertion and leg swelling. Negative for chest pain, palpitations and syncope.         Vitals:   02/21/19 1311  BP: (!) 149/79  Pulse: 68  Resp: 16  Temp: 97.6 F (36.4 C)  SpO2: 97%     Body mass index is 34.15 kg/m. Filed Weights   02/21/19 1311  Weight: 266 lb (120.7 kg)     Objective:   Physical Exam  Constitutional: He appears well-developed and well-nourished.  Neck: No JVD present.  Cardiovascular: Normal rate, regular rhythm, normal heart sounds and intact distal pulses.  No murmur heard. Pulmonary/Chest: Effort normal and breath sounds normal. He has no wheezes. He has no rales.  Musculoskeletal:        General: Edema (1+ b/l) present.  Nursing note and vitals reviewed.       Assessment & Recommendations:   59 year old male with nonischemic cardiomyopathy, hypertension.  Heart failure with reduced ejection fraction: Nonischemic cardiomyopathy.  NYHA class II symptoms. Tolerating Entresto 49-51 mg twice daily, spironolactone 25 mg daily, metoprolol succinate 50 mg daily. Potassium 5.3 on most recent BMP.  Thus, I will hold off further uptitrating Entresto or spironolactone at this time. Added Farxiga 10 mg daily.  Hypertension: Better controlled, barring occasional systolic blood pressures in  the 140s.  Follow-up visit in 6 months.  Nigel Mormon, MD [facility] [place] Pager: [phone] Office: [phone]

## 2019-03-30 ENCOUNTER — Other Ambulatory Visit: Payer: Self-pay | Admitting: Cardiology

## 2019-03-30 DIAGNOSIS — R600 Localized edema: Secondary | ICD-10-CM

## 2019-03-31 ENCOUNTER — Other Ambulatory Visit: Payer: Self-pay

## 2019-03-31 ENCOUNTER — Encounter: Payer: Self-pay | Admitting: Cardiology

## 2019-03-31 ENCOUNTER — Ambulatory Visit: Payer: No Typology Code available for payment source | Admitting: Cardiology

## 2019-03-31 VITALS — BP 142/73 | HR 72 | Temp 98.0°F | Resp 18 | Ht 75.0 in | Wt 259.0 lb

## 2019-03-31 DIAGNOSIS — I447 Left bundle-branch block, unspecified: Secondary | ICD-10-CM

## 2019-03-31 DIAGNOSIS — I428 Other cardiomyopathies: Secondary | ICD-10-CM

## 2019-03-31 DIAGNOSIS — R600 Localized edema: Secondary | ICD-10-CM

## 2019-03-31 DIAGNOSIS — I5020 Unspecified systolic (congestive) heart failure: Secondary | ICD-10-CM

## 2019-03-31 DIAGNOSIS — I10 Essential (primary) hypertension: Secondary | ICD-10-CM

## 2019-03-31 MED ORDER — METOPROLOL SUCCINATE ER 50 MG PO TB24: 50.0000 mg | ORAL_TABLET | Freq: Every day | ORAL | 3 refills | Status: DC

## 2019-03-31 MED ORDER — SPIRONOLACTONE 25 MG PO TABS: 25.0000 mg | ORAL_TABLET | Freq: Every day | ORAL | 3 refills | Status: DC

## 2019-03-31 MED ORDER — ENTRESTO 49-51 MG PO TABS: 1.0000 | ORAL_TABLET | Freq: Two times a day (BID) | ORAL | 2 refills | Status: DC

## 2019-03-31 MED ORDER — FARXIGA 10 MG PO TABS: 10.0000 mg | ORAL_TABLET | Freq: Every day | ORAL | 2 refills | Status: DC

## 2019-03-31 NOTE — Progress Notes (Signed)
Patient referred by [name], L.Marlou Sa, MD for exertional dyspnea  Subjective:   Gabriel Carter, male    DOB: 1960/03/04, 59 y.o.   MRN: [id number]   Chief Complaint  Patient presents with  . HFrEF  . Follow-up     HPI  59 year old male with nonischemic cardiomyopathy, hypertension.  Cardiac MRI and left and right heart catheterization showed nonischemic cardiomyopathy.  His symptoms have improved on guideline directed medical therapy, as below.  His home blood pressure log shows much lower blood pressures than his reading today.  He states that he only started [place] a week and half ago.   Current Outpatient Medications on File Prior to Visit  Medication Sig Dispense Refill  . aspirin EC 81 MG tablet Take 81 mg by mouth daily.     . dapagliflozin propanediol (FARXIGA) 10 MG TABS tablet Take 10 mg by mouth daily before breakfast. 30 tablet 2  . metoprolol succinate (TOPROL-XL) 50 MG 24 hr tablet Take 1 tablet (50 mg total) by mouth daily. Take with or immediately following a meal. 30 tablet 3  . montelukast (SINGULAIR) 10 MG tablet Take 10 mg by mouth daily as needed (allergies).     . sacubitril-valsartan (ENTRESTO) 49-51 MG Take 1 tablet by mouth 2 (two) times daily. 60 tablet 2  . spironolactone (ALDACTONE) 25 MG tablet Take 1 tablet (25 mg total) by mouth daily. 30 tablet 3   No current facility-administered medications on file prior to visit.    Cardiovascular and other pertinent studies:  Cardiac MRI 02/07/19: 1. Mildly dilated let ventricle with mild concentric hypertrophy and moderately to severely decreased systolic function (LVEF = 34%). There is pronounced paradoxical septal motion (consistent with LBBB) and hypokinesis of the basal and mid inferior and apical anterior, septal and inferior wall. There is midwall late gadolinium enhancement in the baseline anteroseptal and inferoseptal walls consistent with decompensated CHF/elevated LVEDP. 2. Normal right  ventricular size, thickness and systolic function (LVEF = 56%). There are no regional wall motion abnormalities. 3.  Normal left and right atrial size. 4. Mildly dilated pulmonary artery measuring 34 mm suggestive of  pulmonary hypertension. 5.  Mild mitral and tricuspid regurgitation.  Collectively, these findings are suggestive of dilated non-ischemic cardiomyopathy. There is no scarring, no signs of infiltrative of inflammatory cardiomyopathy.   EKG 02/21/2019: Sinus  Rhythm  Left bundle branch block.    RHC/LHC/Coronary angiography 02/11/2019: LM: Normal LAD: Normal LCx: Normal RCA: Normal  RA: 3 mmHg RV: 31/0 mmHg  PA: 21/6 mmHg, mean PA 11 mmHg PCW: 11 mmHg LVEDP 6 mmHg CO: 7.4 L/min CI: 3 L/min/m2  Compensated nonischemic cardiomyopathy  Echocardiogram 01/08/2019: Left ventricle cavity is normal in size. Moderate concentric hypertrophy of the left ventricle. Abnormal septal wall motion due to LBBB. In addition, apical thinning and dyskinesis concerning for old infarct.  Severely depressed LV systolic function with EF 20-25% Indeterminate diastolic function.   Left atrial cavity is mildly dilated. Mild (Grade I) mitral regurgitation. Normal right atrial pressure.   Lexiscan Sestamibi stress test 01/01/2019: No previous exam available for comparison. Lexiscan nuclear stress test performed using 1-day protocol. Dilated LV cavity with severe global reduction in myocardial wall motion and thickening. Stress LVEF 33%. SPECT imaging shows small sized, mild intensity, mildly reversible, apical perfusion defect.  High risk study.   EKG 12/24/2018: Sinus rhythm 69 bpm. Left bundle branch block.  Left atrial enlargement.    Recent labs: 02/11/2019: Na/K 141/3.8  02/07/2019: Glucose 101, BUN/Cr 15/1.01.  EGFR 81. Na/K 143/5.3.   01/06/2019: HbA1C 6.1% Chol 171, TG 93, HDL 53, LDL 101 TSH 0.9 normal   12/16/2018: Glucose 111, BUN/Cr 13/0.95. EGFR 81. Na/K 139/4.1.  Rest of the CMP normal H/H 15/43. MCV 95. Platelets 249    Review of Systems  Cardiovascular: Positive for dyspnea on exertion and leg swelling. Negative for chest pain, palpitations and syncope.         Vitals:   03/31/19 1440  BP: (!) 142/73  Pulse: 72  Resp: 18  Temp: 98 F (36.7 C)  SpO2: 97%     Body mass index is 32.37 kg/m. Filed Weights   03/31/19 1440  Weight: 259 lb (117.5 kg)     Objective:   Physical Exam  Constitutional: He appears well-developed and well-nourished.  Neck: No JVD present.  Cardiovascular: Normal rate, regular rhythm, normal heart sounds and intact distal pulses.  No murmur heard. Pulmonary/Chest: Effort normal and breath sounds normal. He has no wheezes. He has no rales.  Musculoskeletal:        General: Edema (Trace b/l) present.  Nursing note and vitals reviewed.       Assessment & Recommendations:   59 year old male with nonischemic cardiomyopathy, hypertension.  Heart failure with reduced ejection fraction: Nonischemic cardiomyopathy.  NYHA class II symptoms. Well compensated on current guideline directed medical therapy.   Continue Entresto 49-51 mg twice daily, spironolactone 25 mg daily, metoprolol succinate 50 mg daily, Farxiga 10 mg daily. Given overall improvement in symptoms and baseline vitals, I have not made any changes to his medications today. Encouraged him to keep a log of his blood pressure and weight readings. We will see him in 6 weeks following repeat BMP.  Hypertension: Better controlled.  Follow-up visit in 6 weeks.  Nigel Mormon, MD [facility] Pager: [phone] Office: [phone]

## 2019-05-24 NOTE — Progress Notes (Signed)
Patient referred by [name], L.Marlou Sa, MD for exertional dyspnea  Subjective:   Gabriel Carter, male    DOB: 1960/12/09, 59 y.o.   MRN: [id number]   Chief Complaint  Patient presents with  . HFrEF  . Follow-up    6 week     HPI  59 y.o. male with hypertension, nonischemic cardiomyopathy.  Cardiac MRI and left and right heart catheterization showed nonischemic cardiomyopathy.  His symptoms have improved on guideline directed medical therapy, as below.  His home blood pressure log shows much lower blood pressures than his reading today.    He noticed some shortness of breath with more than usual activity few days ago, but has improved now. He denies any orthopnea, PND, leg edema.   Current Outpatient Medications on File Prior to Visit  Medication Sig Dispense Refill  . dapagliflozin propanediol (FARXIGA) 10 MG TABS tablet Take 10 mg by mouth daily before breakfast. 60 tablet 2  . metoprolol succinate (TOPROL-XL) 50 MG 24 hr tablet Take 1 tablet (50 mg total) by mouth daily. Take with or immediately following a meal. 60 tablet 3  . montelukast (SINGULAIR) 10 MG tablet Take 10 mg by mouth daily as needed (allergies).     . sacubitril-valsartan (ENTRESTO) 49-51 MG Take 1 tablet by mouth 2 (two) times daily. 120 tablet 2  . spironolactone (ALDACTONE) 25 MG tablet Take 1 tablet (25 mg total) by mouth daily. 60 tablet 3   No current facility-administered medications on file prior to visit.    Cardiovascular and other pertinent studies:  Cardiac MRI 02/07/2019: 1. Mildly dilated let ventricle with mild concentric hypertrophy and moderately to severely decreased systolic function (LVEF = 34%). There is pronounced paradoxical septal motion (consistent with LBBB) and hypokinesis of the basal and mid inferior and apical anterior, septal and inferior wall. There is midwall late gadolinium enhancement in the baseline anteroseptal and inferoseptal walls consistent with decompensated  CHF/elevated LVEDP. 2. Normal right ventricular size, thickness and systolic function (LVEF = 56%). There are no regional wall motion abnormalities. 3.  Normal left and right atrial size. 4. Mildly dilated pulmonary artery measuring 34 mm suggestive of pulmonary hypertension. 5.  Mild mitral and tricuspid regurgitation.  Collectively, these findings are suggestive of dilated non-ischemic cardiomyopathy. There is no scarring, no signs of infiltrative of inflammatory cardiomyopathy.  EKG 02/21/2019: Sinus  Rhythm  Left bundle branch block.   RHC/LHC/Coronary angiography 02/11/2019: LM: Normal LAD: Normal LCx: Normal RCA: Normal  RA: 3 mmHg RV: 31/0 mmHg  PA: 21/6 mmHg, mean PA 11 mmHg PCW: 11 mmHg LVEDP 6 mmHg CO: 7.4 L/min CI: 3 L/min/m2  Compensated nonischemic cardiomyopathy  Echocardiogram 01/08/2019: Left ventricle cavity is normal in size. Moderate concentric hypertrophy of the left ventricle. Abnormal septal wall motion due to LBBB. In addition, apical thinning and dyskinesis concerning for old infarct.  Severely depressed LV systolic function with EF 20-25% Indeterminate diastolic function.   Left atrial cavity is mildly dilated. Mild (Grade I) mitral regurgitation. Normal right atrial pressure.    Recent labs: 02/11/2019: Na/K 141/3.8.  H/H 13.6/40. PLT 277.   02/07/2019: Glucose 101, BUN/Cr 15/1.01. EGFR 81. Na/K 143/5.3.   01/06/2019: HbA1C 6.1% Chol 171, TG 93, HDL 53, LDL 101 TSH 0.9 normal  Review of Systems  Cardiovascular: Positive for dyspnea on exertion. Negative for chest pain, leg swelling, palpitations and syncope.         Vitals:   05/26/19 1057  BP: 138/83  Pulse: 62  Resp:  17  SpO2: 98%     Body mass index is 31.75 kg/m. Filed Weights   05/26/19 1057  Weight: 254 lb (115.2 kg)     Objective:   Physical Exam  Constitutional: He appears well-developed and well-nourished.  Neck: No JVD present.  Cardiovascular: Normal rate,  regular rhythm, normal heart sounds and intact distal pulses.  No murmur heard. Pulmonary/Chest: Effort normal and breath sounds normal. He has no wheezes. He has no rales.  Musculoskeletal:        General: No edema.  Nursing note and vitals reviewed.       Assessment & Recommendations:   59 y.o. male with hypertension, nonischemic cardiomyopathy.  Heart failure with reduced ejection fraction: Nonischemic cardiomyopathy.  NYHA class II symptoms. Well compensated on current guideline directed medical therapy.   Currently on Entresto 49-51 mg bid, spironolactone 25 mg daily, metoprolol succinate 50 mg daily, Farxiga 10 mg daily. Added lasix for as needed use. Repeat echocardiogram.  Hypertension: Well controlled.  F/u in 4 weeks to follow up on shortness of breath symptoms  Nigel Mormon, MD [facility] [place] Pager: [phone] Office: [phone]

## 2019-05-26 ENCOUNTER — Ambulatory Visit (INDEPENDENT_AMBULATORY_CARE_PROVIDER_SITE_OTHER): Payer: No Typology Code available for payment source | Admitting: Cardiology

## 2019-05-26 ENCOUNTER — Encounter: Payer: Self-pay | Admitting: Cardiology

## 2019-05-26 ENCOUNTER — Other Ambulatory Visit: Payer: Self-pay

## 2019-05-26 VITALS — BP 138/83 | HR 62 | Resp 17 | Ht 75.0 in | Wt 254.0 lb

## 2019-05-26 DIAGNOSIS — I428 Other cardiomyopathies: Secondary | ICD-10-CM

## 2019-05-26 DIAGNOSIS — I10 Essential (primary) hypertension: Secondary | ICD-10-CM

## 2019-05-26 DIAGNOSIS — I5020 Unspecified systolic (congestive) heart failure: Secondary | ICD-10-CM

## 2019-05-26 MED ORDER — FUROSEMIDE 20 MG PO TABS: 20.0000 mg | ORAL_TABLET | Freq: Every day | ORAL | 3 refills | Status: DC | PRN

## 2019-05-30 ENCOUNTER — Other Ambulatory Visit: Payer: No Typology Code available for payment source

## 2019-06-06 ENCOUNTER — Ambulatory Visit: Payer: No Typology Code available for payment source

## 2019-06-06 ENCOUNTER — Other Ambulatory Visit: Payer: Self-pay

## 2019-06-06 DIAGNOSIS — I5020 Unspecified systolic (congestive) heart failure: Secondary | ICD-10-CM

## 2019-06-20 ENCOUNTER — Other Ambulatory Visit: Payer: Self-pay

## 2019-06-20 ENCOUNTER — Ambulatory Visit: Payer: No Typology Code available for payment source | Admitting: Cardiology

## 2019-06-20 ENCOUNTER — Encounter: Payer: Self-pay | Admitting: Cardiology

## 2019-06-20 VITALS — BP 143/70 | HR 64 | Resp 17 | Ht 75.0 in | Wt 255.0 lb

## 2019-06-20 DIAGNOSIS — I5020 Unspecified systolic (congestive) heart failure: Secondary | ICD-10-CM

## 2019-06-20 DIAGNOSIS — I10 Essential (primary) hypertension: Secondary | ICD-10-CM

## 2019-06-20 DIAGNOSIS — I428 Other cardiomyopathies: Secondary | ICD-10-CM

## 2019-06-20 MED ORDER — ENTRESTO 97-103 MG PO TABS: 1.0000 | ORAL_TABLET | Freq: Two times a day (BID) | ORAL | 3 refills | Status: DC

## 2019-06-20 NOTE — Progress Notes (Signed)
Patient referred by [name], L.Marlou Sa, MD for exertional dyspnea  Subjective:   Gabriel Carter, male    DOB: 1960/08/03, 59 y.o.   MRN: [id number]   Chief Complaint  Patient presents with  . HFrEF  . Follow-up    2-3 weeks  . Results    echo     HPI  59 y.o. male with hypertension, nonischemic cardiomyopathy.  Shortness of breath has improved, leg edema persists. Blood pressure elevated.   Current Outpatient Medications on File Prior to Visit  Medication Sig Dispense Refill  . dapagliflozin propanediol (FARXIGA) 10 MG TABS tablet Take 10 mg by mouth daily before breakfast. 60 tablet 2  . furosemide (LASIX) 20 MG tablet Take 1 tablet (20 mg total) by mouth daily as needed. 30 tablet 3  . metoprolol succinate (TOPROL-XL) 50 MG 24 hr tablet Take 1 tablet (50 mg total) by mouth daily. Take with or immediately following a meal. 60 tablet 3  . montelukast (SINGULAIR) 10 MG tablet Take 10 mg by mouth daily as needed (allergies).     . sacubitril-valsartan (ENTRESTO) 49-51 MG Take 1 tablet by mouth 2 (two) times daily. 120 tablet 2  . spironolactone (ALDACTONE) 25 MG tablet Take 1 tablet (25 mg total) by mouth daily. 60 tablet 3   No current facility-administered medications on file prior to visit.    Cardiovascular and other pertinent studies:  Echocardiogram 06/06/2019:  Left ventricle cavity is normal in size. Moderate concentric hypertrophy  of the left ventricle. Moderate global and severe anteroseptal  hypokinesis. LVE 40%. Grade I diastolic dysfunction. Normal LAP.   Mild (Grade I) mitral regurgitation.  Normal right atrial pressure.  Compared to previous study on 01/08/2019, LVEF is improved from 20-25% to  35-40%.  Cardiac MRI 02/07/2019: 1. Mildly dilated let ventricle with mild concentric hypertrophy and moderately to severely decreased systolic function (LVEF = 34%). There is pronounced paradoxical septal motion (consistent with LBBB) and hypokinesis of the  basal and mid inferior and apical anterior, septal and inferior wall. There is midwall late gadolinium enhancement in the baseline anteroseptal and inferoseptal walls consistent with decompensated CHF/elevated LVEDP. 2. Normal right ventricular size, thickness and systolic function (LVEF = 56%). There are no regional wall motion abnormalities. 3.  Normal left and right atrial size. 4. Mildly dilated pulmonary artery measuring 34 mm suggestive of pulmonary hypertension. 5.  Mild mitral and tricuspid regurgitation.  Collectively, these findings are suggestive of dilated non-ischemic cardiomyopathy. There is no scarring, no signs of infiltrative of inflammatory cardiomyopathy.  EKG 02/21/2019: Sinus  Rhythm  Left bundle branch block.   RHC/LHC/Coronary angiography 02/11/2019: LM: Normal LAD: Normal LCx: Normal RCA: Normal  RA: 3 mmHg RV: 31/0 mmHg  PA: 21/6 mmHg, mean PA 11 mmHg PCW: 11 mmHg LVEDP 6 mmHg CO: 7.4 L/min CI: 3 L/min/m2  Compensated nonischemic cardiomyopathy  Echocardiogram 01/08/2019: Left ventricle cavity is normal in size. Moderate concentric hypertrophy of the left ventricle. Abnormal septal wall motion due to LBBB. In addition, apical thinning and dyskinesis concerning for old infarct.  Severely depressed LV systolic function with EF 20-25% Indeterminate diastolic function.   Left atrial cavity is mildly dilated. Mild (Grade I) mitral regurgitation. Normal right atrial pressure.    Recent labs: 02/11/2019: Na/K 141/3.8.  H/H 13.6/40. PLT 277.   02/07/2019: Glucose 101, BUN/Cr 15/1.01. EGFR 81. Na/K 143/5.3.   01/06/2019: HbA1C 6.1% Chol 171, TG 93, HDL 53, LDL 101 TSH 0.9 normal  Review of Systems  Cardiovascular: Positive  for dyspnea on exertion. Negative for chest pain, leg swelling, palpitations and syncope.         Vitals:   06/20/19 1509  BP: (!) 143/70  Pulse: 64  Resp: 17  SpO2: 98%     Body mass index is 31.87 kg/m. Filed Weights     06/20/19 1509  Weight: 255 lb (115.7 kg)     Objective:   Physical Exam Vitals and nursing note reviewed.  Constitutional:      Appearance: He is well-developed.  Neck:     Vascular: No JVD.  Cardiovascular:     Rate and Rhythm: Normal rate and regular rhythm.     Pulses: Intact distal pulses.     Heart sounds: Normal heart sounds. No murmur heard.   Pulmonary:     Effort: Pulmonary effort is normal.     Breath sounds: Normal breath sounds. No wheezing or rales.  Musculoskeletal:     Right lower leg: Edema (1+) present.     Left lower leg: Edema (1+) present.         Assessment & Recommendations:   59 y.o. male with hypertension, nonischemic cardiomyopathy.  Heart failure with reduced ejection fraction: Nonischemic cardiomyopathy.  NYHA class II symptoms. Well compensated on current guideline directed medical therapy.   Currently on Entresto 49-51 mg bid, spironolactone 25 mg daily, metoprolol succinate 50 mg daily, Farxiga 10 mg daily. Increased Entresto 97-103 mg bid. Check BMP in 1 week  Hypertension: Well controlled.  F/u in 4 weeks   Higgins, MD [facility] Pager: [phone] Office: [phone]

## 2019-07-05 LAB — BASIC METABOLIC PANEL
BUN/Creatinine Ratio: 13 (ref 9–20)
BUN: 13 mg/dL (ref 6–24)
CO2: 28 mmol/L (ref 20–29)
Calcium: 9.2 mg/dL (ref 8.7–10.2)
Chloride: 101 mmol/L (ref 96–106)
Creatinine, Ser: 1 mg/dL (ref 0.76–1.27)
GFR calc Af Amer: 95 mL/min/{1.73_m2} (ref >59)
GFR calc non Af Amer: 82 mL/min/{1.73_m2} (ref >59)
Glucose: 170 mg/dL — ABNORMAL HIGH (ref 65–99)
Potassium: 4.3 mmol/L (ref 3.5–5.2)
Sodium: 141 mmol/L (ref 134–144)

## 2019-07-16 ENCOUNTER — Ambulatory Visit: Payer: No Typology Code available for payment source | Admitting: Cardiology

## 2019-07-16 ENCOUNTER — Other Ambulatory Visit: Payer: Self-pay

## 2019-07-16 ENCOUNTER — Encounter: Payer: Self-pay | Admitting: Cardiology

## 2019-07-16 VITALS — BP 117/48 | HR 63 | Resp 16 | Ht 75.0 in | Wt 255.0 lb

## 2019-07-16 DIAGNOSIS — I10 Essential (primary) hypertension: Secondary | ICD-10-CM

## 2019-07-16 DIAGNOSIS — I5020 Unspecified systolic (congestive) heart failure: Secondary | ICD-10-CM

## 2019-07-16 DIAGNOSIS — I428 Other cardiomyopathies: Secondary | ICD-10-CM

## 2019-07-16 MED ORDER — ENTRESTO 97-103 MG PO TABS: 1.0000 | ORAL_TABLET | Freq: Two times a day (BID) | ORAL | 3 refills | Status: DC

## 2019-07-16 MED ORDER — SPIRONOLACTONE 25 MG PO TABS: 25.0000 mg | ORAL_TABLET | Freq: Every day | ORAL | 3 refills | Status: DC

## 2019-07-16 MED ORDER — METOPROLOL SUCCINATE ER 50 MG PO TB24: 50.0000 mg | ORAL_TABLET | Freq: Every day | ORAL | 3 refills | Status: DC

## 2019-07-16 MED ORDER — FUROSEMIDE 20 MG PO TABS: 20.0000 mg | ORAL_TABLET | Freq: Every day | ORAL | 3 refills | Status: DC | PRN

## 2019-07-16 MED ORDER — DAPAGLIFLOZIN PROPANEDIOL 10 MG PO TABS: 10.0000 mg | ORAL_TABLET | Freq: Every day | ORAL | 3 refills | Status: DC

## 2019-07-16 NOTE — Progress Notes (Signed)
Patient referred by [name], L.Marlou Sa, MD for exertional dyspnea  Subjective:   Gabriel Carter, male    DOB: 1960/04/23, 59 y.o.   MRN: [id number]   Chief Complaint  Patient presents with  . HFrEF  . Follow-up    4 WEEK     HPI  59 y.o. male with hypertension, nonischemic cardiomyopathy.  Shortness of breath and leg edema have improved. Blood pressure now controlled. He had lightheadedness for a week after increasing Entresto dose to 97-103 mg bid, now resolved.  Recent BMP discussed with the patient.   Current Outpatient Medications on File Prior to Visit  Medication Sig Dispense Refill  . dapagliflozin propanediol (FARXIGA) 10 MG TABS tablet Take 10 mg by mouth daily before breakfast. 60 tablet 2  . furosemide (LASIX) 20 MG tablet Take 1 tablet (20 mg total) by mouth daily as needed. 30 tablet 3  . metoprolol succinate (TOPROL-XL) 50 MG 24 hr tablet Take 1 tablet (50 mg total) by mouth daily. Take with or immediately following a meal. 60 tablet 3  . montelukast (SINGULAIR) 10 MG tablet Take 10 mg by mouth daily as needed (allergies).     . sacubitril-valsartan (ENTRESTO) 97-103 MG Take 1 tablet by mouth 2 (two) times daily. 180 tablet 3  . spironolactone (ALDACTONE) 25 MG tablet Take 1 tablet (25 mg total) by mouth daily. 60 tablet 3   No current facility-administered medications on file prior to visit.    Cardiovascular and other pertinent studies:  Echocardiogram 06/06/2019:  Left ventricle cavity is normal in size. Moderate concentric hypertrophy  of the left ventricle. Moderate global and severe anteroseptal  hypokinesis. LVE 40%. Grade I diastolic dysfunction. Normal LAP.   Mild (Grade I) mitral regurgitation.  Normal right atrial pressure.  Compared to previous study on 01/08/2019, LVEF is improved from 20-25% to  35-40%.  Cardiac MRI 02/07/2019: 1. Mildly dilated let ventricle with mild concentric hypertrophy and moderately to severely decreased systolic  function (LVEF = 34%). There is pronounced paradoxical septal motion (consistent with LBBB) and hypokinesis of the basal and mid inferior and apical anterior, septal and inferior wall. There is midwall late gadolinium enhancement in the baseline anteroseptal and inferoseptal walls consistent with decompensated CHF/elevated LVEDP. 2. Normal right ventricular size, thickness and systolic function (LVEF = 56%). There are no regional wall motion abnormalities. 3.  Normal left and right atrial size. 4. Mildly dilated pulmonary artery measuring 34 mm suggestive of pulmonary hypertension. 5.  Mild mitral and tricuspid regurgitation.  Collectively, these findings are suggestive of dilated non-ischemic cardiomyopathy. There is no scarring, no signs of infiltrative of inflammatory cardiomyopathy.  EKG 02/21/2019: Sinus  Rhythm  Left bundle branch block.   RHC/LHC/Coronary angiography 02/11/2019: LM: Normal LAD: Normal LCx: Normal RCA: Normal  RA: 3 mmHg RV: 31/0 mmHg  PA: 21/6 mmHg, mean PA 11 mmHg PCW: 11 mmHg LVEDP 6 mmHg CO: 7.4 L/min CI: 3 L/min/m2  Compensated nonischemic cardiomyopathy  Echocardiogram 01/08/2019: Left ventricle cavity is normal in size. Moderate concentric hypertrophy of the left ventricle. Abnormal septal wall motion due to LBBB. In addition, apical thinning and dyskinesis concerning for old infarct.  Severely depressed LV systolic function with EF 20-25% Indeterminate diastolic function.   Left atrial cavity is mildly dilated. Mild (Grade I) mitral regurgitation. Normal right atrial pressure.    Recent labs: 07/04/2019: Glucose 170, BUN/Cr 13/1.0. EGFR 82. Na/K 141/4.3. Rest of the CMP normal  02/11/2019: Na/K 141/3.8.  H/H 13.6/40. PLT 277.  02/07/2019: Glucose 101, BUN/Cr 15/1.01. EGFR 81. Na/K 143/5.3.   01/06/2019: HbA1C 6.1% Chol 171, TG 93, HDL 53, LDL 101 TSH 0.9 normal  Review of Systems  Cardiovascular: Positive for dyspnea on exertion.  Negative for chest pain, leg swelling, palpitations and syncope.         Vitals:   07/16/19 1335  BP: (!) 117/48  Pulse: 63  Resp: 16  SpO2: 98%     Body mass index is 31.87 kg/m. Filed Weights   07/16/19 1335  Weight: 255 lb (115.7 kg)     Objective:   Physical Exam Vitals and nursing note reviewed.  Constitutional:      Appearance: He is well-developed.  Neck:     Vascular: No JVD.  Cardiovascular:     Rate and Rhythm: Normal rate and regular rhythm.     Pulses: Intact distal pulses.     Heart sounds: Normal heart sounds. No murmur heard.   Pulmonary:     Effort: Pulmonary effort is normal.     Breath sounds: Normal breath sounds. No wheezing or rales.  Musculoskeletal:     Right lower leg: Edema (Trace) present.     Left lower leg: Edema (Trace) present.         Assessment & Recommendations:   59 y.o. male with hypertension, nonischemic cardiomyopathy.  Heart failure with reduced ejection fraction: Nonischemic cardiomyopathy.  NYHA class II symptoms. Well compensated on current guideline directed medical therapy.   Currently on Entresto 97-103 mg bid, spironolactone 25 mg daily, metoprolol succinate 50 mg daily, Farxiga 10 mg daily. Encourage liberal hydration to avoid lightheadedness, hypotension, AKI. Repeat echocardiogram 12/2019  Hypertension: Well controlled.  F/u in 6 months   Dacia Capers Esther Hardy, MD [facility] [place] Pager: [phone] Office: [phone]

## 2020-01-16 ENCOUNTER — Other Ambulatory Visit: Payer: Self-pay

## 2020-01-16 ENCOUNTER — Ambulatory Visit: Payer: No Typology Code available for payment source

## 2020-01-16 DIAGNOSIS — I5020 Unspecified systolic (congestive) heart failure: Secondary | ICD-10-CM

## 2020-01-23 ENCOUNTER — Ambulatory Visit: Payer: No Typology Code available for payment source | Admitting: Cardiology

## 2020-01-26 ENCOUNTER — Encounter: Payer: Self-pay | Admitting: Cardiology

## 2020-01-26 ENCOUNTER — Telehealth: Payer: No Typology Code available for payment source | Admitting: Cardiology

## 2020-01-26 ENCOUNTER — Other Ambulatory Visit: Payer: Self-pay

## 2020-01-26 VITALS — BP 117/78 | HR 70 | Wt 240.0 lb

## 2020-01-26 DIAGNOSIS — I10 Essential (primary) hypertension: Secondary | ICD-10-CM

## 2020-01-26 DIAGNOSIS — I5020 Unspecified systolic (congestive) heart failure: Secondary | ICD-10-CM

## 2020-01-26 NOTE — Progress Notes (Signed)
Patient referred by [name], L.Marlou Sa, MD for exertional dyspnea  Subjective:   Gabriel Carter, male    DOB: [birth year], 60 y.o.   MRN: [id number]   I connected with the patient on 01/26/2020 by a video enabled telemedicine application and verified that I am speaking with the correct person using two identifiers.     I discussed the limitations of evaluation and management by telemedicine and the availability of in person appointments. The patient expressed understanding and agreed to proceed.   This visit type was conducted due to national recommendations for restrictions regarding the COVID-19 Pandemic (e.g. social distancing).  This format is felt to be most appropriate for this patient at this time.  All issues noted in this document were discussed and addressed.  No physical exam was performed (except for noted visual exam findings with Tele health visits).  The patient has consented to conduct a Tele health visit and understands insurance will be billed.   Chief Complaint  Patient presents with  . Cardiomyopathy     HPI  60 y.o. male with hypertension, nonischemic cardiomyopathy.  He had COVID in 11/2019. This pegged him back with exertional dyspnea. He has recovered since then. He still has NYHA class II dyspnea symptoms and mild leg edema.  Reviewed recent echocardiogram results with the patient, details below.    Current Outpatient Medications on File Prior to Visit  Medication Sig Dispense Refill  . dapagliflozin propanediol (FARXIGA) 10 MG TABS tablet Take 1 tablet (10 mg total) by mouth daily before breakfast. 90 tablet 3  . furosemide (LASIX) 20 MG tablet Take 1 tablet (20 mg total) by mouth daily as needed. 90 tablet 3  . metoprolol succinate (TOPROL-XL) 50 MG 24 hr tablet Take 1 tablet (50 mg total) by mouth daily. Take with or immediately following a meal. 90 tablet 3  . montelukast (SINGULAIR) 10 MG tablet Take 10 mg by mouth daily as needed (allergies).     .  sacubitril-valsartan (ENTRESTO) 97-103 MG Take 1 tablet by mouth 2 (two) times daily. 180 tablet 3  . spironolactone (ALDACTONE) 25 MG tablet Take 1 tablet (25 mg total) by mouth daily. 90 tablet 3   No current facility-administered medications on file prior to visit.    Cardiovascular and other pertinent studies:  Echocardiogram 01/16/2020:  Left ventricle cavity is normal in size. Mild concentric hypertrophy of  the left ventricle. Hypokinetic global wall motion. I cannot exclude  anterioseptal and inferoseptal severe hypokinesis to akinesis. This may be  due to abnormal septal wall motion due to left bundle branch block.   Doppler evidence of grade I (impaired) diastolic dysfunction, normal LAP.  Moderately depressed LV systolic function with visual EF 35-40%.  Calculated EF 45%.  Left atrial cavity is mildly dilated.  Structurally normal tricuspid valve with trace regurgitation. No evidence  of pulmonary hypertension.  No significant change from 06/06/2019.   Cardiac MRI 02/07/2019: 1. Mildly dilated let ventricle with mild concentric hypertrophy and moderately to severely decreased systolic function (LVEF = 34%). There is pronounced paradoxical septal motion (consistent with LBBB) and hypokinesis of the basal and mid inferior and apical anterior, septal and inferior wall. There is midwall late gadolinium enhancement in the baseline anteroseptal and inferoseptal walls consistent with decompensated CHF/elevated LVEDP. 2. Normal right ventricular size, thickness and systolic function (LVEF = 56%). There are no regional wall motion abnormalities. 3.  Normal left and right atrial size. 4. Mildly dilated pulmonary artery measuring 34 mm suggestive  of pulmonary hypertension. 5.  Mild mitral and tricuspid regurgitation.  Collectively, these findings are suggestive of dilated non-ischemic cardiomyopathy. There is no scarring, no signs of infiltrative of inflammatory  cardiomyopathy.  EKG 02/21/2019: Sinus  Rhythm  Left bundle branch block.   RHC/LHC/Coronary angiography 02/11/2019: LM: Normal LAD: Normal LCx: Normal RCA: Normal  RA: 3 mmHg RV: 31/0 mmHg  PA: 21/6 mmHg, mean PA 11 mmHg PCW: 11 mmHg LVEDP 6 mmHg CO: 7.4 L/min CI: 3 L/min/m2  Compensated nonischemic cardiomyopathy  Recent labs: 07/04/2019: Glucose 170, BUN/Cr 13/1.0. EGFR 82. Na/K 141/4.3. Rest of the CMP normal  02/11/2019: Na/K 141/3.8.  H/H 13.6/40. PLT 277.   02/07/2019: Glucose 101, BUN/Cr 15/1.01. EGFR 81. Na/K 143/5.3.   01/06/2019: HbA1C 6.1% Chol 171, TG 93, HDL 53, LDL 101 TSH 0.9 normal  Review of Systems  Cardiovascular: Positive for dyspnea on exertion. Negative for chest pain, leg swelling, palpitations and syncope.        Vitals:   01/26/20 1401  BP: 117/78  Pulse: 70     Body mass index is 30 kg/m. Filed Weights   01/26/20 1401  Weight: 240 lb (108.9 kg)     Objective:   Physical Exam Vitals and nursing note reviewed.  Constitutional:      General: He is not in acute distress.    Appearance: He is well-developed and well-nourished.  Pulmonary:     Effort: Pulmonary effort is normal.  Neurological:     Mental Status: He is alert and oriented to person, place, and time.  Psychiatric:        Mood and Affect: Mood and affect normal.         Assessment & Recommendations:   60 y.o. male with hypertension, nonischemic cardiomyopathy.  Heart failure with reduced ejection fraction: Nonischemic cardiomyopathy.  NYHA class II symptoms. Currently on Entresto 97-103 mg bid, spironolactone 25 mg daily, metoprolol succinate 50 mg daily, Farxiga 10 mg daily. Encourage liberal hydration to avoid lightheadedness, hypotension, AKI. Will check BMP. If Cr and K would allow, will increase spironolactone to 50 mg daily.  Hypertension: Well controlled.  F/u in 6 weeks  Shanty Ginty Esther Hardy, MD [facility] [place] Pager:  [phone] Office: [phone]

## 2020-02-03 ENCOUNTER — Other Ambulatory Visit: Payer: Self-pay | Admitting: Cardiology

## 2020-02-03 DIAGNOSIS — I10 Essential (primary) hypertension: Secondary | ICD-10-CM

## 2020-02-03 DIAGNOSIS — I5020 Unspecified systolic (congestive) heart failure: Secondary | ICD-10-CM

## 2020-02-04 LAB — BASIC METABOLIC PANEL
BUN/Creatinine Ratio: 12 (ref 10–24)
BUN: 11 mg/dL (ref 8–27)
CO2: 26 mmol/L (ref 20–29)
Calcium: 9.5 mg/dL (ref 8.6–10.2)
Chloride: 106 mmol/L (ref 96–106)
Creatinine, Ser: 0.91 mg/dL (ref 0.76–1.27)
GFR calc Af Amer: 106 mL/min/{1.73_m2} (ref >59)
GFR calc non Af Amer: 91 mL/min/{1.73_m2} (ref >59)
Glucose: 95 mg/dL (ref 65–99)
Potassium: 4.5 mmol/L (ref 3.5–5.2)
Sodium: 143 mmol/L (ref 134–144)

## 2020-02-04 MED ORDER — SPIRONOLACTONE 50 MG PO TABS: 50.0000 mg | ORAL_TABLET | Freq: Every day | ORAL | 3 refills | Status: DC

## 2020-02-04 NOTE — Addendum Note (Signed)
Addended by: Elder Negus on: 02/04/2020 08:21 AM   Modules accepted: Orders

## 2020-03-11 ENCOUNTER — Ambulatory Visit: Payer: No Typology Code available for payment source | Admitting: Cardiology

## 2020-03-11 ENCOUNTER — Other Ambulatory Visit: Payer: Self-pay

## 2020-03-11 ENCOUNTER — Encounter: Payer: Self-pay | Admitting: Cardiology

## 2020-03-11 VITALS — BP 116/71 | HR 74 | Temp 97.6°F | Resp 16 | Ht 75.0 in | Wt 245.0 lb

## 2020-03-11 DIAGNOSIS — I428 Other cardiomyopathies: Secondary | ICD-10-CM

## 2020-03-11 DIAGNOSIS — I10 Essential (primary) hypertension: Secondary | ICD-10-CM

## 2020-03-11 DIAGNOSIS — I5020 Unspecified systolic (congestive) heart failure: Secondary | ICD-10-CM

## 2020-03-11 NOTE — Progress Notes (Signed)
Patient referred by [name], L.Marlou Sa, MD for exertional dyspnea  Subjective:   Gabriel Carter, male    DOB: 02/23/1960, 59 y.o.   MRN: [id number]    Chief Complaint  Patient presents with  . HFrEF (heart failure with reduced ejection fraction)   . Follow-up  . Results     HPI  60 y.o. male with hypertension, nonischemic cardiomyopathy.  He had COVID in 11/2019. This pegged him back with exertional dyspnea. He iss recoverng from it. Exertional dyspnea is improved, but remains.    Current Outpatient Medications on File Prior to Visit  Medication Sig Dispense Refill  . dapagliflozin propanediol (FARXIGA) 10 MG TABS tablet Take 1 tablet (10 mg total) by mouth daily before breakfast. 90 tablet 3  . furosemide (LASIX) 20 MG tablet Take 1 tablet (20 mg total) by mouth daily as needed. (Patient taking differently: Take 20 mg by mouth daily.) 90 tablet 3  . metoprolol succinate (TOPROL-XL) 50 MG 24 hr tablet Take 1 tablet (50 mg total) by mouth daily. Take with or immediately following a meal. 90 tablet 3  . montelukast (SINGULAIR) 10 MG tablet Take 10 mg by mouth daily as needed (allergies).     . sacubitril-valsartan (ENTRESTO) 97-103 MG Take 1 tablet by mouth 2 (two) times daily. 180 tablet 3  . spironolactone (ALDACTONE) 50 MG tablet Take 1 tablet (50 mg total) by mouth daily. 30 tablet 3   No current facility-administered medications on file prior to visit.    Cardiovascular and other pertinent studies:  EKG 03/11/2020: Sinus rhythm 63 bpm Left bundle branch block  Echocardiogram 01/16/2020:  Left ventricle cavity is normal in size. Mild concentric hypertrophy of  the left ventricle. Hypokinetic global wall motion. I cannot exclude  anterioseptal and inferoseptal severe hypokinesis to akinesis. This may be  due to abnormal septal wall motion due to left bundle branch block.   Doppler evidence of grade I (impaired) diastolic dysfunction, normal LAP.  Moderately  depressed LV systolic function with visual EF 35-40%.  Calculated EF 45%.  Left atrial cavity is mildly dilated.  Structurally normal tricuspid valve with trace regurgitation. No evidence  of pulmonary hypertension.  No significant change from 06/06/2019.   Cardiac MRI 02/07/2019: 1. Mildly dilated let ventricle with mild concentric hypertrophy and moderately to severely decreased systolic function (LVEF = 34%). There is pronounced paradoxical septal motion (consistent with LBBB) and hypokinesis of the basal and mid inferior and apical anterior, septal and inferior wall. There is midwall late gadolinium enhancement in the baseline anteroseptal and inferoseptal walls consistent with decompensated CHF/elevated LVEDP. 2. Normal right ventricular size, thickness and systolic function (LVEF = 56%). There are no regional wall motion abnormalities. 3.  Normal left and right atrial size. 4. Mildly dilated pulmonary artery measuring 34 mm suggestive of pulmonary hypertension. 5.  Mild mitral and tricuspid regurgitation.  Collectively, these findings are suggestive of dilated non-ischemic cardiomyopathy. There is no scarring, no signs of infiltrative of inflammatory cardiomyopathy.  RHC/LHC/Coronary angiography 02/11/2019: LM: Normal LAD: Normal LCx: Normal RCA: Normal  RA: 3 mmHg RV: 31/0 mmHg  PA: 21/6 mmHg, mean PA 11 mmHg PCW: 11 mmHg LVEDP 6 mmHg CO: 7.4 L/min CI: 3 L/min/m2  Compensated nonischemic cardiomyopathy  Recent labs: 02/03/2020: Glucose 95, BUN/Cr 11/0.91. EGFR 91. Na/K 143/4.5.  H/H 13/40  07/04/2019: Glucose 170, BUN/Cr 13/1.0. EGFR 82. Na/K 141/4.3. Rest of the CMP normal  02/11/2019: Na/K 141/3.8.  H/H 13.6/40. PLT 277.   02/07/2019: Glucose 101,  BUN/Cr 15/1.01. EGFR 81. Na/K 143/5.3.   01/06/2019: HbA1C 6.1% Chol 171, TG 93, HDL 53, LDL 101 TSH 0.9 normal  Review of Systems  Cardiovascular: Positive for dyspnea on exertion. Negative for chest pain, leg  swelling, palpitations and syncope.        Vitals:   03/11/20 1325  BP: 116/71  Pulse: 74  Resp: 16  Temp: 97.6 F (36.4 C)  SpO2: 97%     Body mass index is 30.62 kg/m. Filed Weights   03/11/20 1325  Weight: 245 lb (111.1 kg)     Objective:   Physical Exam Vitals and nursing note reviewed.  Constitutional:      General: He is not in acute distress.    Appearance: He is well-developed.  Pulmonary:     Effort: Pulmonary effort is normal.  Musculoskeletal:     Right lower leg: No edema.     Left lower leg: No edema.  Neurological:     Mental Status: He is alert and oriented to person, place, and time.         Assessment & Recommendations:   60 y.o. male with hypertension, nonischemic cardiomyopathy.  Heart failure with reduced ejection fraction: Nonischemic cardiomyopathy.  NYHA class II symptoms. Currently on Entresto 97-103 mg bid, spironolactone 50 mg daily, metoprolol succinate 50 mg daily, Farxiga 10 mg daily. EF around 40%. He is not interested in CRT-P at this time. Will refer to cardiac rehab  Hypertension: Well controlled.  F/u in 6 months  Manish Esther Hardy, MD [facility] [place] Pager: [phone] Office: [phone]

## 2020-03-17 ENCOUNTER — Telehealth (HOSPITAL_COMMUNITY): Payer: Self-pay | Admitting: *Deleted

## 2020-03-17 NOTE — Telephone Encounter (Signed)
Received referral from Dr. Rosemary Holms for this pt to participate in Cardiac rehab.  Noted that pt lives in [place].  Called and left message for pt to please return call.  Would like to see if pt has a preference of location for his cardiac rehab - [place] verses [facility].  Contact information provided. Alanson Aly, BSN Cardiac and Emergency planning/management officer

## 2020-03-20 ENCOUNTER — Other Ambulatory Visit: Payer: Self-pay | Admitting: Cardiology

## 2020-03-20 DIAGNOSIS — I5020 Unspecified systolic (congestive) heart failure: Secondary | ICD-10-CM

## 2020-03-24 ENCOUNTER — Telehealth (HOSPITAL_COMMUNITY): Payer: Self-pay | Admitting: *Deleted

## 2020-03-24 NOTE — Telephone Encounter (Signed)
Mr. Brase had not responded to my message left on 3/16.  Called and left second message for pt to please contact Cardiac rehab. Called and spoke to pt wife, Idell Pickles who is listed on his DPR regarding multiple messages left with no return call to confirm that we are using the correct phone number.  Idell Pickles stated that " Yes you are. He probably isn't picking up because he doesn't want to do the program.  He has never exercised. He has an active job and I do understand that this is different from exercising. Towanda Octave that my call was to see if he had a preference of location [place] verses [facility] ( pt lives in [place]). Idell Pickles replied, oh if he knew that he might pick up.  I will let him know to contact you". Thanked me for the call. Alanson Aly, BSN Cardiac and Emergency planning/management officer

## 2020-04-09 ENCOUNTER — Other Ambulatory Visit: Payer: Self-pay

## 2020-04-09 DIAGNOSIS — I5020 Unspecified systolic (congestive) heart failure: Secondary | ICD-10-CM

## 2020-04-09 MED ORDER — DAPAGLIFLOZIN PROPANEDIOL 10 MG PO TABS: 10.0000 mg | ORAL_TABLET | Freq: Every day | ORAL | 3 refills | Status: DC

## 2020-04-12 ENCOUNTER — Other Ambulatory Visit: Payer: Self-pay

## 2020-04-12 DIAGNOSIS — I5020 Unspecified systolic (congestive) heart failure: Secondary | ICD-10-CM

## 2020-04-12 MED ORDER — DAPAGLIFLOZIN PROPANEDIOL 10 MG PO TABS: 10.0000 mg | ORAL_TABLET | Freq: Every day | ORAL | 3 refills | Status: DC

## 2020-04-13 ENCOUNTER — Other Ambulatory Visit: Payer: Self-pay

## 2020-04-13 DIAGNOSIS — I5020 Unspecified systolic (congestive) heart failure: Secondary | ICD-10-CM

## 2020-04-13 MED ORDER — DAPAGLIFLOZIN PROPANEDIOL 10 MG PO TABS: 10.0000 mg | ORAL_TABLET | Freq: Every day | ORAL | 3 refills | Status: DC

## 2020-04-14 ENCOUNTER — Other Ambulatory Visit: Payer: Self-pay

## 2020-04-14 DIAGNOSIS — I5020 Unspecified systolic (congestive) heart failure: Secondary | ICD-10-CM

## 2020-04-14 MED ORDER — SPIRONOLACTONE 50 MG PO TABS: 50.0000 mg | ORAL_TABLET | Freq: Every day | ORAL | 1 refills | Status: DC

## 2020-04-14 MED ORDER — FUROSEMIDE 20 MG PO TABS: 20.0000 mg | ORAL_TABLET | Freq: Every day | ORAL | 3 refills | Status: DC | PRN

## 2020-04-14 MED ORDER — METOPROLOL SUCCINATE ER 50 MG PO TB24
50.0000 mg | ORAL_TABLET | Freq: Every day | ORAL | 1 refills | Status: DC
Start: 2020-04-14 — End: 2020-08-03

## 2020-04-14 MED ORDER — ENTRESTO 97-103 MG PO TABS: 1.0000 | ORAL_TABLET | Freq: Two times a day (BID) | ORAL | 3 refills | Status: DC

## 2020-04-14 MED ORDER — DAPAGLIFLOZIN PROPANEDIOL 10 MG PO TABS: 10.0000 mg | ORAL_TABLET | Freq: Every day | ORAL | 3 refills | Status: DC

## 2020-08-03 ENCOUNTER — Other Ambulatory Visit: Payer: Self-pay

## 2020-08-03 DIAGNOSIS — I5020 Unspecified systolic (congestive) heart failure: Secondary | ICD-10-CM

## 2020-08-04 MED ORDER — METOPROLOL SUCCINATE ER 50 MG PO TB24: 50.0000 mg | ORAL_TABLET | Freq: Every day | ORAL | 1 refills | Status: DC

## 2020-08-12 ENCOUNTER — Telehealth: Payer: Self-pay | Admitting: Cardiology

## 2020-08-12 NOTE — Telephone Encounter (Signed)
Pt says he hasn't been able to access prescription for Farxiga. Pt states insurance won't cover this & a discount he was supposed to have towards this doesn't apply without the insurance coverage. Wanted to speak with someone about this.

## 2020-08-12 NOTE — Telephone Encounter (Signed)
Will do pa and see if that helps with cost of not patient assistance

## 2020-09-13 ENCOUNTER — Ambulatory Visit: Payer: No Typology Code available for payment source | Admitting: Cardiology

## 2020-09-14 NOTE — Progress Notes (Signed)
Patient referred by [name], L.Marlou Sa, MD for exertional dyspnea  Subjective:   Gabriel Carter, male    DOB: 24-Oct-1960, 60 y.o.   MRN: [id number]    Chief Complaint  Patient presents with   HFrEF (heart failure with reduced ejection fraction)    Hypertension   Follow-up    6 month     HPI  60 y.o. male with hypertension, nonischemic cardiomyopathy.  Patient recently has been out of [place] due to insurance coverage issues.  He has noticed recent increase in his leg swelling.  He does have exertional dyspnea, but is not consistently reproducible with same level of activity.  For example, on good days, he can walk several flights of stairs relatable duties, without any symptoms.  On other days, he does have exertional dyspnea minimal activity.  He denies any chest pain.  He has gained weight in the recent past.  He attributes this to eating "junk food".     Current Outpatient Medications on File Prior to Visit  Medication Sig Dispense Refill   dapagliflozin propanediol (FARXIGA) 10 MG TABS tablet Take 1 tablet (10 mg total) by mouth daily before breakfast. 90 tablet 3   furosemide (LASIX) 20 MG tablet Take 1 tablet (20 mg total) by mouth daily as needed. 90 tablet 3   metoprolol succinate (TOPROL-XL) 50 MG 24 hr tablet Take 1 tablet (50 mg total) by mouth daily. Take with or immediately following a meal. 90 tablet 1   montelukast (SINGULAIR) 10 MG tablet Take 10 mg by mouth daily as needed (allergies).      sacubitril-valsartan (ENTRESTO) 97-103 MG Take 1 tablet by mouth 2 (two) times daily. 180 tablet 3   spironolactone (ALDACTONE) 50 MG tablet Take 1 tablet (50 mg total) by mouth daily. 90 tablet 1   No current facility-administered medications on file prior to visit.    Cardiovascular and other pertinent studies:  EKG 09/17/2020: Sinus rhythm 67 bpm First degree AV block Left bundle branch block   Echocardiogram 01/16/2020:  Left ventricle cavity is normal in size.  Mild concentric hypertrophy of  the left ventricle. Hypokinetic global wall motion. I cannot exclude  anterioseptal and inferoseptal severe hypokinesis to akinesis. This may be  due to abnormal septal wall motion due to left bundle branch block.    Doppler evidence of grade I (impaired) diastolic dysfunction, normal LAP.  Moderately depressed LV systolic function with visual EF 35-40%.  Calculated EF 45%.  Left atrial cavity is mildly dilated.  Structurally normal tricuspid valve with trace regurgitation. No evidence  of pulmonary hypertension.  No significant change from 06/06/2019.   Cardiac MRI 02/07/2019: 1. Mildly dilated let ventricle with mild concentric hypertrophy and moderately to severely decreased systolic function (LVEF = 34%). There is pronounced paradoxical septal motion (consistent with LBBB) and hypokinesis of the basal and mid inferior and apical anterior, septal and inferior wall. There is midwall late gadolinium enhancement in the baseline anteroseptal and inferoseptal walls consistent with decompensated CHF/elevated LVEDP. 2. Normal right ventricular size, thickness and systolic function (LVEF = 56%). There are no regional wall motion abnormalities. 3.  Normal left and right atrial size. 4. Mildly dilated pulmonary artery measuring 34 mm suggestive of pulmonary hypertension. 5.  Mild mitral and tricuspid regurgitation.   Collectively, these findings are suggestive of dilated non-ischemic cardiomyopathy. There is no scarring, no signs of infiltrative of inflammatory cardiomyopathy.   RHC/LHC/Coronary angiography 02/11/2019: LM: Normal LAD: Normal LCx: Normal RCA: Normal   RA:  3 mmHg RV: 31/0 mmHg  PA: 21/6 mmHg, mean PA 11 mmHg PCW: 11 mmHg LVEDP 6 mmHg CO: 7.4 L/min CI: 3 L/min/m2   Compensated nonischemic cardiomyopathy  Recent labs: 02/03/2020: Glucose 95, BUN/Cr 11/0.91. EGFR 91. Na/K 143/4.5.  H/H 13/40  07/04/2019: Glucose 170, BUN/Cr 13/1.0. EGFR  82. Na/K 141/4.3. Rest of the CMP normal  02/11/2019: Na/K 141/3.8.  H/H 13.6/40. PLT 277.   02/07/2019: Glucose 101, BUN/Cr 15/1.01. EGFR 81. Na/K 143/5.3.   01/06/2019: HbA1C 6.1% Chol 171, TG 93, HDL 53, LDL 101 TSH 0.9 normal  Review of Systems  Cardiovascular:  Positive for dyspnea on exertion. Negative for chest pain, leg swelling, palpitations and syncope.       Vitals:   09/17/20 0936  BP: 129/71  Pulse: 66  Resp: 16  Temp: 97.8 F (36.6 C)  SpO2: 98%     Body mass index is 33.25 kg/m. Filed Weights   09/17/20 0936  Weight: 266 lb (120.7 kg)     Objective:   Physical Exam Vitals and nursing note reviewed.  Constitutional:      General: He is not in acute distress.    Appearance: He is well-developed.  Pulmonary:     Effort: Pulmonary effort is normal.  Musculoskeletal:     Right lower leg: Edema (2+) present.     Left lower leg: Edema (2+) present.  Neurological:     Mental Status: He is alert and oriented to person, place, and time.        Assessment & Recommendations:   60 y.o. male with hypertension, nonischemic cardiomyopathy.  Heart failure with reduced ejection fraction: Nonischemic cardiomyopathy.  NYHA class II symptoms. Currently on Entresto 97-103 mg bid, spironolactone 50 mg daily, metoprolol succinate 50 mg daily. Resume Farxiga 10 mg daily, samples given and will work on patient assistance if needed. EF around 40%. He is not interested in CRT-P at this time. He did not undergo cardiac rehab owing to his work duties. Follow-up in 3 months.  If symptoms of leg edema and exertional dyspnea do not improve, we will readdress the discussion of CRT-P. Check BMP, proBNP today.  If proBNP is elevated, will discuss participation in [place] clinical trial. Counseled regarding diet and lifestyle changes, with focus on reducing salt intake.  Hypertension: Well controlled.  F/u in 3 months  Emma, MD [facility]  Cardiovascular. [place] Pager: [phone] Office: [phone]

## 2020-09-17 ENCOUNTER — Other Ambulatory Visit: Payer: Self-pay

## 2020-09-17 ENCOUNTER — Ambulatory Visit: Payer: No Typology Code available for payment source | Admitting: Cardiology

## 2020-09-17 ENCOUNTER — Encounter: Payer: Self-pay | Admitting: Cardiology

## 2020-09-17 VITALS — BP 129/71 | HR 66 | Temp 97.8°F | Resp 16 | Ht 75.0 in | Wt 266.0 lb

## 2020-09-17 DIAGNOSIS — I5020 Unspecified systolic (congestive) heart failure: Secondary | ICD-10-CM

## 2020-09-17 DIAGNOSIS — I428 Other cardiomyopathies: Secondary | ICD-10-CM

## 2020-09-17 DIAGNOSIS — I10 Essential (primary) hypertension: Secondary | ICD-10-CM

## 2020-10-06 ENCOUNTER — Other Ambulatory Visit: Payer: Self-pay | Admitting: Cardiology

## 2020-10-06 DIAGNOSIS — I5020 Unspecified systolic (congestive) heart failure: Secondary | ICD-10-CM

## 2020-10-13 LAB — BASIC METABOLIC PANEL
BUN/Creatinine Ratio: 18 (ref 10–24)
BUN: 16 mg/dL (ref 8–27)
CO2: 27 mmol/L (ref 20–29)
Calcium: 9.5 mg/dL (ref 8.6–10.2)
Chloride: 100 mmol/L (ref 96–106)
Creatinine, Ser: 0.91 mg/dL (ref 0.76–1.27)
Glucose: 109 mg/dL — ABNORMAL HIGH (ref 70–99)
Potassium: 4.5 mmol/L (ref 3.5–5.2)
Sodium: 141 mmol/L (ref 134–144)
eGFR: 96 mL/min/{1.73_m2} (ref >59)

## 2020-10-13 LAB — PRO B NATRIURETIC PEPTIDE: NT-Pro BNP: 22 pg/mL (ref 0–210)

## 2020-11-17 ENCOUNTER — Telehealth: Payer: Self-pay | Admitting: Cardiology

## 2020-11-17 ENCOUNTER — Other Ambulatory Visit: Payer: Self-pay

## 2020-11-17 DIAGNOSIS — I5020 Unspecified systolic (congestive) heart failure: Secondary | ICD-10-CM

## 2020-11-17 MED ORDER — SPIRONOLACTONE 50 MG PO TABS: ORAL_TABLET | ORAL | 1 refills | Status: DC

## 2020-11-17 NOTE — Telephone Encounter (Signed)
Done

## 2020-11-17 NOTE — Telephone Encounter (Signed)
Patient requesting refill for spironolcatone 50 mg.

## 2020-12-17 ENCOUNTER — Ambulatory Visit: Payer: No Typology Code available for payment source | Admitting: Cardiology

## 2021-01-04 NOTE — Progress Notes (Signed)
Patient referred by [name], L.Marlou Sa, MD for exertional dyspnea  Subjective:   Gabriel Carter, male    DOB: 01/29/1960, 61 y.o.   MRN: [id number]    Chief Complaint  Patient presents with   HFrEF   Follow-up    3 month     HPI  61 y.o. male with hypertension, nonischemic cardiomyopathy.  Patient has had good days and bad days with his breathing.  On bad days, he is not able to walk for than 5-6 steps without getting short of breath.  Leg edema persists.  Blood pressure is elevated today, but usually well controlled.    Current Outpatient Medications on File Prior to Visit  Medication Sig Dispense Refill   dapagliflozin propanediol (FARXIGA) 10 MG TABS tablet Take 1 tablet (10 mg total) by mouth daily before breakfast. 90 tablet 3   furosemide (LASIX) 20 MG tablet Take 1 tablet (20 mg total) by mouth daily as needed. 90 tablet 3   metoprolol succinate (TOPROL-XL) 50 MG 24 hr tablet Take 1 tablet (50 mg total) by mouth daily. Take with or immediately following a meal. 90 tablet 1   montelukast (SINGULAIR) 10 MG tablet Take 10 mg by mouth daily as needed (allergies).      sacubitril-valsartan (ENTRESTO) 97-103 MG Take 1 tablet by mouth 2 (two) times daily. 180 tablet 3   spironolactone (ALDACTONE) 50 MG tablet TAKE 1 TABLET(50 MG) BY MOUTH DAILY 90 tablet 1   No current facility-administered medications on file prior to visit.    Cardiovascular and other pertinent studies:  EKG 09/17/2020: Sinus rhythm 67 bpm First degree AV block Left bundle branch block (QRS duration 156 msec)   Echocardiogram 01/16/2020:  Left ventricle cavity is normal in size. Mild concentric hypertrophy of  the left ventricle. Hypokinetic global wall motion. I cannot exclude  anterioseptal and inferoseptal severe hypokinesis to akinesis. This may be  due to abnormal septal wall motion due to left bundle branch block.    Doppler evidence of grade I (impaired) diastolic dysfunction, normal LAP.   Moderately depressed LV systolic function with visual EF 35-40%.  Calculated EF 45%.  Left atrial cavity is mildly dilated.  Structurally normal tricuspid valve with trace regurgitation. No evidence  of pulmonary hypertension.  No significant change from 06/06/2019.   Cardiac MRI 02/07/2019: 1. Mildly dilated let ventricle with mild concentric hypertrophy and moderately to severely decreased systolic function (LVEF = 34%). There is pronounced paradoxical septal motion (consistent with LBBB) and hypokinesis of the basal and mid inferior and apical anterior, septal and inferior wall. There is midwall late gadolinium enhancement in the baseline anteroseptal and inferoseptal walls consistent with decompensated CHF/elevated LVEDP. 2. Normal right ventricular size, thickness and systolic function (LVEF = 56%). There are no regional wall motion abnormalities. 3.  Normal left and right atrial size. 4. Mildly dilated pulmonary artery measuring 34 mm suggestive of pulmonary hypertension. 5.  Mild mitral and tricuspid regurgitation.   Collectively, these findings are suggestive of dilated non-ischemic cardiomyopathy. There is no scarring, no signs of infiltrative of inflammatory cardiomyopathy.   RHC/LHC/Coronary angiography 02/11/2019: LM: Normal LAD: Normal LCx: Normal RCA: Normal   RA: 3 mmHg RV: 31/0 mmHg  PA: 21/6 mmHg, mean PA 11 mmHg PCW: 11 mmHg LVEDP 6 mmHg CO: 7.4 L/min CI: 3 L/min/m2   Compensated nonischemic cardiomyopathy  Recent labs: 10/12/2020: Glucose 109, BUN/Cr 16/0.91. EGFR >90. Na/K 141/4.5. Rest of the CMP normal NT pro BNP 22 normal  02/03/2020: Glucose  95, BUN/Cr 11/0.91. EGFR 91. Na/K 143/4.5.  H/H 13/40  07/04/2019: Glucose 170, BUN/Cr 13/1.0. EGFR 82. Na/K 141/4.3. Rest of the CMP normal  02/11/2019: Na/K 141/3.8.  H/H 13.6/40. PLT 277.   02/07/2019: Glucose 101, BUN/Cr 15/1.01. EGFR 81. Na/K 143/5.3.   01/06/2019: HbA1C 6.1% Chol 171, TG 93, HDL 53,  LDL 101 TSH 0.9 normal  Review of Systems  Cardiovascular:  Positive for dyspnea on exertion and leg swelling. Negative for chest pain, palpitations and syncope.       Vitals:   01/05/21 1040 01/05/21 1042  BP: (!) 134/97 (!) 151/81  Pulse: 69 63  Resp: 16   Temp: 98 F (36.7 C)   SpO2: 97%      Body mass index is 32.87 kg/m. Filed Weights   01/05/21 1040  Weight: 263 lb (119.3 kg)     Objective:   Physical Exam Vitals and nursing note reviewed.  Constitutional:      General: He is not in acute distress.    Appearance: He is well-developed.  Pulmonary:     Effort: Pulmonary effort is normal.  Musculoskeletal:     Right lower leg: Edema (1+) present.     Left lower leg: Edema (1+) present.  Neurological:     Mental Status: He is alert and oriented to person, place, and time.        Assessment & Recommendations:   61 y.o. male with hypertension, nonischemic cardiomyopathy.  Heart failure with reduced ejection fraction: Nonischemic cardiomyopathy.  NYHA class II symptoms. Currently on Entresto 97-103 mg bid, spironolactone 50 mg daily, metoprolol succinate 50 mg daily, Farxiga 10 mg daily.  Increase Lasix to 40 mg daily. EF around 40% in 01/2020.  Left bundle branch block with QRS duration 156 msec (09/2020) Will repeat echocardiogram and refer to EP for consideration for CRT-P. Unlike before, he is now open to the idea of CRT-P. Counseled regarding diet and lifestyle changes, with focus on reducing salt intake.  Hypertension: Generally well controlled.  Monitor for now.  F/u in 3 months  Big Clifty, MD [facility] Cardiovascular. [place] Pager: [phone] Office: [phone]

## 2021-01-05 ENCOUNTER — Other Ambulatory Visit: Payer: Self-pay

## 2021-01-05 ENCOUNTER — Ambulatory Visit: Payer: 59 | Admitting: Cardiology

## 2021-01-05 ENCOUNTER — Encounter: Payer: Self-pay | Admitting: Cardiology

## 2021-01-05 VITALS — BP 151/81 | HR 63 | Temp 98.0°F | Resp 16 | Ht 75.0 in | Wt 263.0 lb

## 2021-01-05 DIAGNOSIS — I10 Essential (primary) hypertension: Secondary | ICD-10-CM

## 2021-01-05 DIAGNOSIS — I428 Other cardiomyopathies: Secondary | ICD-10-CM | POA: Diagnosis not present

## 2021-01-05 DIAGNOSIS — I447 Left bundle-branch block, unspecified: Secondary | ICD-10-CM | POA: Diagnosis not present

## 2021-01-05 DIAGNOSIS — I5020 Unspecified systolic (congestive) heart failure: Secondary | ICD-10-CM

## 2021-01-05 MED ORDER — FUROSEMIDE 40 MG PO TABS: 40.0000 mg | ORAL_TABLET | Freq: Every day | ORAL | 3 refills | Status: DC | PRN

## 2021-02-04 ENCOUNTER — Other Ambulatory Visit: Payer: Self-pay

## 2021-02-04 ENCOUNTER — Ambulatory Visit: Payer: 59 | Admitting: Internal Medicine

## 2021-02-04 ENCOUNTER — Encounter: Payer: Self-pay | Admitting: Internal Medicine

## 2021-02-04 VITALS — BP 112/68 | HR 69 | Ht 75.0 in | Wt 261.6 lb

## 2021-02-04 DIAGNOSIS — I5020 Unspecified systolic (congestive) heart failure: Secondary | ICD-10-CM | POA: Diagnosis not present

## 2021-02-04 DIAGNOSIS — I428 Other cardiomyopathies: Secondary | ICD-10-CM

## 2021-02-04 DIAGNOSIS — I447 Left bundle-branch block, unspecified: Secondary | ICD-10-CM

## 2021-02-04 NOTE — Patient Instructions (Signed)
Medication Instructions:  Your physician recommends that you continue on your current medications as directed. Please refer to the Current Medication list given to you today.  *If you need a refill on your cardiac medications before your next appointment, please call your pharmacy*   Lab Work: None ordered.  If you have labs (blood work) drawn today and your tests are completely normal, you will receive your results only by: MyChart Message (if you have MyChart) OR A paper copy in the mail If you have any lab test that is abnormal or we need to change your treatment, we will call you to review the results.   Testing/Procedures: None ordered.    Follow-Up: At [facility], you and your health needs are our priority.  As part of our continuing mission to provide you with exceptional heart care, we have created designated Provider Care Teams.  These Care Teams include your primary Cardiologist (physician) and Advanced Practice Providers (APPs -  Physician Assistants and Nurse Practitioners) who all work together to provide you with the care you need, when you need it.  We recommend signing up for the patient portal called "MyChart".  Sign up information is provided on this After Visit Summary.  MyChart is used to connect with patients for Virtual Visits (Telemedicine).  Patients are able to view lab/test results, encounter notes, upcoming appointments, etc.  Non-urgent messages can be sent to your provider as well.   To learn more about what you can do with MyChart, go to [email or web address].    Your next appointment:   I will have Dr Odessa Fleming scheduler call you to schedule follow up

## 2021-02-04 NOTE — Progress Notes (Signed)
ELECTROPHYSIOLOGY CONSULT NOTE  Patient ID: Gabriel Carter, MRN: [id number], DOB/AGE: [birth year] 61 y.o. Admit date: (Not on file) Date of Consult: 02/04/2021  Primary Physician: [name], L.Marlou Sa, MD Primary Cardiologist: Ronnette Juniper     Gabriel Carter is a 61 y.o. male who is being seen today for the evaluation of CRT at the request of Dr MPa.    HPI Gabriel Carter is a 61 y.o. male referred for consideration of CRT in setting NICM identified 2021 when he presented with severe dyspnea on exertion some peripheral edema.  Was well until the spring 2020.  Developed an illness possibly COVID, no testing available at that time, and after that developed became progressive dyspnea that prompted medical attention as noted above about a year later.  Also with lightheadedness with standing, but no presyncope or syncope no chest pain.  No palpitations.  Longstanding hypertension.  Now well controlled.     DATE TEST EF   12/20 Stress  33 %   1/21 Echo  20-25 %   2/21 cMRI 34%   2/21 RHC/LHC  No obstructive CAD  6/21 Echo 35-40 %   1/22 Echo 35-40 % (Calc 45%)    Date Cr K Hgb  2/22 0.91 4.5 13.6 (2/21)  10/22 0.91 4.5       He does not smoke or consume alcohol.  No history of aneurysms in his family.  Past Medical History:  Diagnosis Date   Arthritis    Heart murmur    Hypertension       Surgical History:  Past Surgical History:  Procedure Laterality Date   APPENDECTOMY     RIGHT/LEFT HEART CATH AND CORONARY ANGIOGRAPHY N/A 02/11/2019   Procedure: RIGHT/LEFT HEART CATH AND CORONARY ANGIOGRAPHY;  Surgeon: Nigel Mormon, MD;  Location: [facility];  Service: Cardiovascular;  Laterality: N/A;   TONSILLECTOMY     age 22     Home Meds: Current Meds  Medication Sig   dapagliflozin propanediol (FARXIGA) 10 MG TABS tablet Take 1 tablet (10 mg total) by mouth daily before breakfast.   furosemide (LASIX) 40 MG tablet Take 1 tablet (40 mg total) by mouth daily as  needed.   metoprolol succinate (TOPROL-XL) 50 MG 24 hr tablet Take 1 tablet (50 mg total) by mouth daily. Take with or immediately following a meal.   montelukast (SINGULAIR) 10 MG tablet Take 10 mg by mouth daily as needed (allergies).    sacubitril-valsartan (ENTRESTO) 97-103 MG Take 1 tablet by mouth 2 (two) times daily.   spironolactone (ALDACTONE) 50 MG tablet TAKE 1 TABLET(50 MG) BY MOUTH DAILY    Allergies:  Allergies  Allergen Reactions   Erythromycin Other (See Comments)    Told since he was a child   Lactose Intolerance (Gi)     Upset stomach   Naproxen Rash   Vioxx [Rofecoxib] Rash    Social History   Socioeconomic History   Marital status: Married    Spouse name: Not on file   Number of children: 2   Years of education: Not on file   Highest education level: Not on file  Occupational History   Not on file  Tobacco Use   Smoking status: Never   Smokeless tobacco: Never  Vaping Use   Vaping Use: Never used  Substance and Sexual Activity   Alcohol use: No    Alcohol/week: 0.0 standard drinks   Drug use: No   Sexual activity: Not on file  Other Topics Concern   Not on file  Social History Narrative   Not on file   Social Determinants of Health   Financial Resource Strain: Not on file  Food Insecurity: Not on file  Transportation Needs: Not on file  Physical Activity: Not on file  Stress: Not on file  Social Connections: Not on file  Intimate Partner Violence: Not on file     Family History  Problem Relation Age of Onset   Hypertension Mother    Diabetes Father    Heart disease Father    Cancer Maternal Grandmother    Hypertension Maternal Grandmother    Heart disease Maternal Grandfather    Stroke Maternal Grandfather    Hypertension Paternal Grandmother    Cancer Paternal Grandfather      ROS:  Please see the history of present illness.     All other systems reviewed and negative.    Physical Exam:  Blood pressure 112/68, pulse 69,  height 6\' 3"  (1.905 m), weight 261 lb 9.6 oz (118.7 kg). General: Well developed, well nourished male in no acute distress. Head: Normocephalic, atraumatic, sclera non-icteric, no xanthomas, nares are without discharge. EENT: normal  Lymph Nodes:  none Neck: Negative for carotid bruits. JVD not elevated. Back:without scoliosis kyphosis Lungs: Clear bilaterally to auscultation without wheezes, rales, or rhonchi. Breathing is unlabored. Heart: RRR with S1 S2. No  murmur . No rubs, or gallops appreciated. Abdomen: Soft, non-tender, non-distended with normoactive bowel sounds. No hepatomegaly. No rebound/guarding. No obvious abdominal masses. Msk:  Strength and tone appear normal for age. Extremities: No clubbing or cyanosis. tr edema.  Distal pedal pulses are 2+ and equal bilaterally. Skin: Warm and Dry Neuro: Alert and oriented X 3. CN III-XII intact Grossly normal sensory and motor function . Psych:  Responds to questions appropriately with a normal affect.        EKG: sinus @ 68 20/16/46 Fragmentation 1, L, V4, V5   Assessment and Plan:  Nonischemic cardiomyopathy  Left bundle branch block  Congestive heart failure-chronic-systolic class IIb-IIIa  Sleep disordered breathing  Erectile dysfunction  Has congestive symptoms in the setting of left bundle branch block with fragmentation and a nonischemic cardiomyopathy, ejection fraction not recently ascertained.  Appropriate to anticipate benefit from CRT and whether he should have a pacer or defibrillator would be informed by his left ventricular ejection fraction.  Echo is pending.  We have reviewed the potential benefits of resynchronization on her cardiac physiology with left bundle branch block and on congestive heart failure symptoms.  He is mild volume overloaded and so we will have him increase his furosemide from 40 daily to 40 twice a day every other day for 10 days.  He has sleep disordered breathing and suspect, with his  daytime fatigue, that he would benefit from a sleep study.   Virl Axe

## 2021-02-20 ENCOUNTER — Other Ambulatory Visit: Payer: Self-pay | Admitting: Cardiology

## 2021-02-20 DIAGNOSIS — I5020 Unspecified systolic (congestive) heart failure: Secondary | ICD-10-CM

## 2021-03-07 ENCOUNTER — Ambulatory Visit: Payer: 59

## 2021-03-07 ENCOUNTER — Other Ambulatory Visit: Payer: Self-pay

## 2021-03-07 DIAGNOSIS — I447 Left bundle-branch block, unspecified: Secondary | ICD-10-CM | POA: Diagnosis not present

## 2021-03-07 DIAGNOSIS — I5020 Unspecified systolic (congestive) heart failure: Secondary | ICD-10-CM | POA: Diagnosis not present

## 2021-03-15 NOTE — Progress Notes (Signed)
Looks like EF has improved. ? ?Gabriel Carter

## 2021-03-16 ENCOUNTER — Other Ambulatory Visit: Payer: Self-pay | Admitting: Cardiology

## 2021-03-16 DIAGNOSIS — I5020 Unspecified systolic (congestive) heart failure: Secondary | ICD-10-CM

## 2021-04-01 ENCOUNTER — Ambulatory Visit: Payer: 59 | Admitting: Internal Medicine

## 2021-04-01 ENCOUNTER — Encounter: Payer: Self-pay | Admitting: Internal Medicine

## 2021-04-01 VITALS — BP 106/74 | HR 70 | Ht 74.0 in | Wt 263.0 lb

## 2021-04-01 DIAGNOSIS — I447 Left bundle-branch block, unspecified: Secondary | ICD-10-CM | POA: Diagnosis not present

## 2021-04-01 DIAGNOSIS — I428 Other cardiomyopathies: Secondary | ICD-10-CM

## 2021-04-01 DIAGNOSIS — I5020 Unspecified systolic (congestive) heart failure: Secondary | ICD-10-CM | POA: Diagnosis not present

## 2021-04-01 MED ORDER — SPIRONOLACTONE 25 MG PO TABS: 25.0000 mg | ORAL_TABLET | Freq: Every day | ORAL | 3 refills | Status: DC

## 2021-04-01 NOTE — Progress Notes (Signed)
? ? ? ? ?Patient Care Team: ?[name], L.Marlou Sa, MD as PCP - General (Family Medicine) ? ? ?HPI ? ?Gabriel Carter is a 61 y.o. male seen in followup for consideration of CRT in setting Of NICM identified 2021? Post covid ? ?The patient denies chest pain, nocturnal dyspnea, orthopnea; some peripheral edema.  There have been no palpitations or syncope.  Complains of intermittent DOE but also a notable decrease in functional status which is a new baseline ?Some LH with standing "all my life." ? ?We had tried changing his diuretics to weekend to avoid interfering with his work too much, but notices no difference in edema from Sundays ( after 2 days) vs on Fridays ? ? ? ? ?  ?DATE TEST EF    ?12/20 Stress  33 %    ?1/21 Echo  20-25 %    ?2/21 cMRI 34%    ?2/21 RHC/LHC   No obstructive CAD  ?6/21 Echo 35-40 %    ?1/22 Echo 35-40 % (Calc 45%)    ?3/23 Echo  45-50%   ?     ?  ?Date Cr K Hgb  ?2/22 0.91 4.5 13.6 (2/21)  ?10/22 0.91 4.5    ?     ?  ?  ? ?Records and Results Reviewed  ? ?Past Medical History:  ?Diagnosis Date  ? Arthritis   ? Heart murmur   ? Hypertension   ? ? ?Past Surgical History:  ?Procedure Laterality Date  ? APPENDECTOMY    ? RIGHT/LEFT HEART CATH AND CORONARY ANGIOGRAPHY N/A 02/11/2019  ? Procedure: RIGHT/LEFT HEART CATH AND CORONARY ANGIOGRAPHY;  Surgeon: Nigel Mormon, MD;  Location: [facility];  Service: Cardiovascular;  Laterality: N/A;  ? TONSILLECTOMY    ? age 71  ? ? ?Current Meds  ?Medication Sig  ? dapagliflozin propanediol (FARXIGA) 10 MG TABS tablet Take 1 tablet (10 mg total) by mouth daily before breakfast.  ? furosemide (LASIX) 40 MG tablet Take 1 tablet (40 mg total) by mouth daily as needed.  ? metoprolol succinate (TOPROL-XL) 50 MG 24 hr tablet TAKE 1 TABLET BY MOUTH DAILY WITH OR IMMEDIATELY FOLLOWING A MEAL  ? montelukast (SINGULAIR) 10 MG tablet Take 10 mg by mouth daily as needed (allergies).   ? spironolactone (ALDACTONE) 50 MG tablet TAKE 1 TABLET(50 MG) BY MOUTH  DAILY  ? ? ?Allergies  ?Allergen Reactions  ? Erythromycin Other (See Comments)  ?  Told since he was a child  ? Lactose Intolerance (Gi)   ?  Upset stomach  ? Other   ?  Linsinopril-hctz - Other reaction(s): sexual side effects  ? Naproxen Rash  ? Vioxx [Rofecoxib] Rash  ? ? ? ? ?Review of Systems negative except from HPI and PMH ? ?Physical Exam ?BP 106/74   Pulse 70   Ht 6\' 2"  (1.88 m)   Wt 263 lb (119.3 kg)   SpO2 98%   BMI 33.77 kg/m?  ?Well developed and well nourished in no acute distress ?HENT normal ?E scleral and icterus clear ?Neck Supple ?JVP flat; carotids brisk and full ?Clear to ausculation ?Regular rate and rhythm, no murmurs gallops or rub ?Soft with active bowel sounds ?No clubbing cyanosis  Edema ?Alert and oriented, grossly normal motor and sensory function ?Skin Warm and Dry ? ?ECG sinus @ 70 ?20/16/46 ?LBBB with notching  ? ?CrCl cannot be calculated (Patient's most recent lab result is older than the maximum 21 days allowed.). ? ? ?Assessment and  Plan ? ?  Nonischemic cardiomyopathy ?  ?Left bundle branch block ?  ?Congestive heart failure-chronic-systolic class IIb-IIIa ?  ?Sleep disordered breathing ?  ?NICM has continued to improve and now no longer in the indication range for an ICD.  He still has CHF symptoms and could be considered for CRT=P although indications. The guidelines are variable regarding the class of CHF wtiht mrEF  that would justify CRT in LBBB.  Given his duration at 160 if has moderate Heartfailure limitations on CPX, would offer CRT and he is interested in that approach ? ?Given LH, have decreased his spiro 50>>25 and take at night   he is to see MP next week and will defer the continuation of this to him ?Also suggested that if his edema worsens he can take more furosemide  ? ? ? ?Current medicines are reviewed at length with the patient today .  The patient does not have concerns regarding medicines. ? ?

## 2021-04-01 NOTE — Patient Instructions (Addendum)
Medication Instructions:  ?Your physician has recommended you make the following change in your medication: ?DECREASE Spironolactone to 25 mg once daily at bedtime ? ?*If you need a refill on your cardiac medications before your next appointment, please call your pharmacy* ? ? ?Lab Work: ?None ordered ? ? ?Testing/Procedures: ?Your physician has recommended that you have a cardiopulmonary stress test (CPX). CPX testing is a non-invasive measurement of heart and lung function. It replaces a traditional treadmill stress test. This type of test provides a tremendous amount of information that relates not only to your present condition but also for future outcomes. This test combines measurements of you ventilation, respiratory gas exchange in the lungs, electrocardiogram (EKG), blood pressure and physical response before, during, and following an exercise protocol ? ? ?Follow-Up: ?At [facility], you and your health needs are our priority.  As part of our continuing mission to provide you with exceptional heart care, we have created designated Provider Care Teams.  These Care Teams include your primary Cardiologist (physician) and Advanced Practice Providers (APPs -  Physician Assistants and Nurse Practitioners) who all work together to provide you with the care you need, when you need it. ? ?Your next appointment:   ?3 week(s) after your CPX testing ? ?The format for your next appointment:   ?Virtual Visit  ? ?Provider:   ?Virl Axe, MD ? ? ? ?Thank you for choosing [facility]!! ? ? ? ? ?[phone] ? ? ?Other Instructions ? ?Cardiopulmonary Exercise Stress Test ?Cardiopulmonary exercise testing (CPET) is a test that checks how the heart and lungs react to exercise. This is called "exercise capacity." During this test, you will walk or run on a treadmill or pedal on a stationary bike. As you walk or run, tests will be done on your heart and lungs. You may have this test to: ?Find out why you are short of  breath. ?Check for exercise intolerance. ?See how your lungs work. ?See how your heart works. ?Check whether your heart or lungs are responding to treatments. ?Check if you have a heart or lung problem. ?Check if you are healthy enough to have surgery. ?Tell your doctor about: ?Any allergies you have. ?All medicines you are taking. ?Any problems you or family members have had with anesthetic medicines. ?Any blood disorders you have. ?Any surgeries you have had. ?Any medical conditions you have. ?Whether you are pregnant or may be pregnant. ?What are the risks? ?Generally, this is a safe test. However, problems may occur, including: ?Chest pain. ?Shortness of breath. ?Leg pain. ?Irregular heartbeat. ?What happens before the procedure? ?Follow instructions from your doctor about what you cannot eat or drink. ?Ask your doctor about changing or stopping your normal medicines. ?Wear loose, comfortable clothing and shoes. ?If you use an inhaler, bring it with you to the test. ?What happens during the procedure? ? ?A blood pressure cuff will be placed on your arm. ?Stick-on patches (electrodes) will be placed on your chest. They will be attached to an EKG machine. ?A clip-on monitor that shows the amount of oxygen in your blood will be placed on your finger (pulse oximeter). ?A clip will be placed on your nose and a mouthpiece will be placed in your mouth. This may be held in place with a headpiece. You will breathe through the mouthpiece during the test. ?You will be asked to start exercising. You will be closely watched while you exercise. ?The amount of effort for your exercise will be slowly increased. ?During exercise,  the test will measure: ?Your heart rate. ?Your heart rhythm. ?Your oxygen blood level. ?The amount of oxygen and carbon dioxide that you breathe out. ?The test will end when: ?You have finished the test. ?You have reached your maximum ability to exercise. ?You have chest or leg pain, dizziness, or  shortness of breath. ?The test may vary among doctors and hospitals. ?What can I expect after the test? ?Your blood pressure, heart rate, breathing rate, and blood oxygen level will be monitored until you leave the hospital or clinic. ?Summary ?Cardiopulmonary exercise testing (CPET) is a test that checks how your heart and lungs react to exercise. ?Follow your doctor's instructions about food and drink, and what medicines to change or stop. ?During this test, you will walk or run on a treadmill or pedal on a stationary bike. Tests will be done as you run or walk. ?This information is not intended to replace advice given to you by your health care provider. Make sure you discuss any questions you have with your health care provider. ?Document Revised: 03/07/2018 Document Reviewed: 08/23/2017 ?Elsevier Patient Education ? 2022 [employer]. ? ?  ?

## 2021-04-06 ENCOUNTER — Encounter: Payer: Self-pay | Admitting: Cardiology

## 2021-04-06 ENCOUNTER — Ambulatory Visit: Payer: 59 | Admitting: Cardiology

## 2021-04-06 VITALS — BP 137/81 | HR 67 | Temp 98.0°F | Resp 17 | Ht 74.0 in | Wt 266.0 lb

## 2021-04-06 DIAGNOSIS — I10 Essential (primary) hypertension: Secondary | ICD-10-CM | POA: Diagnosis not present

## 2021-04-06 DIAGNOSIS — I5020 Unspecified systolic (congestive) heart failure: Secondary | ICD-10-CM

## 2021-04-06 DIAGNOSIS — I447 Left bundle-branch block, unspecified: Secondary | ICD-10-CM

## 2021-04-06 MED ORDER — DAPAGLIFLOZIN PROPANEDIOL 10 MG PO TABS: 10.0000 mg | ORAL_TABLET | Freq: Every day | ORAL | 3 refills | Status: DC

## 2021-04-06 NOTE — Progress Notes (Signed)
? ? ?Patient referred by [name], L.Marlou Sa, MD for exertional dyspnea ? ?Subjective:  ? ?Gabriel Carter, male    DOB: [birth year], 62 y.o.   MRN: [id number] ? ? ? ?Chief Complaint  ?Patient presents with  ? HFrEF (heart failure with reduced ejection fraction) ([place])  ? Follow-up  ?  41month ? ? ? ?HPI ? ?61y.o. male with hypertension, nonischemic cardiomyopathy. ? ?LVEF has improved to about 45%, however, he remains symptomatic with dyspnea negative.  He saw Dr. KCaryl Comes who referred CRT-P.  Patient has had difficulty with FWilder Glade and seeks patient assistance. ? ?Current Outpatient Medications:  ?  dapagliflozin propanediol (FARXIGA) 10 MG TABS tablet, Take 1 tablet (10 mg total) by mouth daily before breakfast., Disp: 90 tablet, Rfl: 3 ?  furosemide (LASIX) 40 MG tablet, Take 1 tablet (40 mg total) by mouth daily as needed., Disp: 90 tablet, Rfl: 3 ?  metoprolol succinate (TOPROL-XL) 50 MG 24 hr tablet, TAKE 1 TABLET BY MOUTH DAILY WITH OR IMMEDIATELY FOLLOWING A MEAL, Disp: 30 tablet, Rfl: 0 ?  montelukast (SINGULAIR) 10 MG tablet, Take 10 mg by mouth daily as needed (allergies). , Disp: , Rfl:  ?  sacubitril-valsartan (ENTRESTO) 97-103 MG, Take 1 tablet by mouth 2 (two) times daily. (Patient not taking: Reported on 04/01/2021), Disp: 180 tablet, Rfl: 3 ?  spironolactone (ALDACTONE) 25 MG tablet, Take 1 tablet (25 mg total) by mouth daily., Disp: 90 tablet, Rfl: 3 ? ? ? ?Cardiovascular and other pertinent studies: ? ?EKG 04/06/2021: ?Sinus rhythm 66 bpm ?LBBB ? ?Echocardiogram 03/07/2021:  ?Left ventricle cavity is normal in size. Mild concentric hypertrophy of  ?the left ventricle. Abnormal and hypokinetic septal wall motion due to  ?left bundle branch block. Mildly depressed LV systolic function with  ?visual EF 45-50%. Doppler evidence of grade I (impaired) diastolic  ?dysfunction, normal LAP.  ?No significant valvular abnormality.  ?Compared to previous study on 01/16/2020, LVEF has improved from 35-40%.  ? ?EKG  09/17/2020: ?Sinus rhythm 67 bpm ?First degree AV block ?Left bundle branch block (QRS duration 156 msec) ? ? ?Echocardiogram 01/16/2020:  ?Left ventricle cavity is normal in size. Mild concentric hypertrophy of  ?the left ventricle. Hypokinetic global wall motion. I cannot exclude  ?anterioseptal and inferoseptal severe hypokinesis to akinesis. This may be  ?due to abnormal septal wall motion due to left bundle branch block.    ?Doppler evidence of grade I (impaired) diastolic dysfunction, normal LAP.  ?Moderately depressed LV systolic function with visual EF 35-40%.  ?Calculated EF 45%.  ?Left atrial cavity is mildly dilated.  ?Structurally normal tricuspid valve with trace regurgitation. No evidence  ?of pulmonary hypertension.  ?No significant change from 06/06/2019.  ? ?Cardiac MRI 02/07/2019: ?1. Mildly dilated let ventricle with mild concentric hypertrophy and moderately to severely decreased systolic function (LVEF = 34%). ?There is pronounced paradoxical septal motion (consistent with LBBB) and hypokinesis of the basal and mid inferior and apical anterior, ?septal and inferior wall. There is midwall late gadolinium enhancement in the baseline anteroseptal and inferoseptal walls ?consistent with decompensated CHF/elevated LVEDP. ?2. Normal right ventricular size, thickness and systolic function (LVEF = 56%). There are no regional wall motion abnormalities. ?3.  Normal left and right atrial size. ?4. Mildly dilated pulmonary artery measuring 34 mm suggestive of pulmonary hypertension. ?5.  Mild mitral and tricuspid regurgitation. ?  ?Collectively, these findings are suggestive of dilated non-ischemic cardiomyopathy. There is no scarring, no signs of infiltrative of inflammatory cardiomyopathy. ?  ?RHC/LHC/Coronary angiography  02/11/2019: ?LM: Normal ?LAD: Normal ?LCx: Normal ?RCA: Normal ?  ?RA: 3 mmHg ?RV: 31/0 mmHg  ?PA: 21/6 mmHg, mean PA 11 mmHg ?PCW: 11 mmHg ?LVEDP 6 mmHg ?CO: 7.4 L/min ?CI: 3 L/min/m2 ?   ?Compensated nonischemic cardiomyopathy ? ?Recent labs: ?10/12/2020: ?Glucose 109, BUN/Cr 16/0.91. EGFR >90. Na/K 141/4.5. Rest of the CMP normal ?NT pro BNP 22 normal ? ?02/03/2020: ?Glucose 95, BUN/Cr 11/0.91. EGFR 91. Na/K 143/4.5.  ?H/H 13/40 ? ?07/04/2019: ?Glucose 170, BUN/Cr 13/1.0. EGFR 82. Na/K 141/4.3. Rest of the CMP normal ? ?02/11/2019: ?Na/K 141/3.8.  ?H/H 13.6/40. PLT 277.  ? ?02/07/2019: ?Glucose 101, BUN/Cr 15/1.01. EGFR 81. Na/K 143/5.3.  ? ?01/06/2019: ?HbA1C 6.1% ?Chol 171, TG 93, HDL 53, LDL 101 ?TSH 0.9 normal ? ?Review of Systems  ?Cardiovascular:  Positive for dyspnea on exertion and leg swelling. Negative for chest pain, palpitations and syncope.  ? ?   ? ?Vitals:  ? 04/06/21 1401 04/06/21 1408  ?BP: (!) 113/91 137/81  ?Pulse: 61 67  ?Resp: 17   ?Temp: 98 ?F (36.7 ?C)   ?SpO2: 96%   ? ? ? ?Body mass index is 34.15 kg/m?. ?Filed Weights  ? 04/06/21 1401  ?Weight: 266 lb (120.7 kg)  ? ? ? ?Objective:  ? Physical Exam ?Vitals and nursing note reviewed.  ?Constitutional:   ?   General: He is not in acute distress. ?   Appearance: He is well-developed.  ?Pulmonary:  ?   Effort: Pulmonary effort is normal.  ?Musculoskeletal:  ?   Right lower leg: Edema (1+) present.  ?   Left lower leg: Edema (1+) present.  ?Neurological:  ?   Mental Status: He is alert and oriented to person, place, and time.  ? ?  ICD-10-CM   ?1. HFrEF (heart failure with reduced ejection fraction) (HCC)  I50.20 dapagliflozin propanediol (FARXIGA) 10 MG TABS tablet  ?  ?2. LBBB (left bundle branch block)  I44.7   ?  ?3. Essential hypertension  I10 EKG 12-Lead  ?  ? ? ?   ? ?Meds ordered this encounter  ?Medications  ? dapagliflozin propanediol (FARXIGA) 10 MG TABS tablet  ?  Sig: Take 1 tablet (10 mg total) by mouth daily before breakfast.  ?  Dispense:  90 tablet  ?  Refill:  3  ?  approved through 03/16/2020  ? ? ?Assessment & Recommendations:  ? ?61 y.o. male with hypertension, nonischemic cardiomyopathy. ? ?Heart failure with reduced  ejection fraction: ?Nonischemic cardiomyopathy, LVEF 45 to 50% (03/2021).   ?NYHA class II symptoms. ?LBBB >150 ms ?Continue Entresto 97-103 mg bid, spironolactone 50 mg daily, metoprolol succinate 50 mg daily. ?Refilled Farxiga 10 mg daily, coronary fracture supported patient assistance program information. ?Continue Lasix 40 mg daily, with additional doses of 40 mg daily on weekends. ? ?Hypertension: ?Well-controlled  ? ?F/u in 6 months ? ?Nigel Mormon, MD ?[facility] ?Pager: [phone] ?Office: [phone] ?

## 2021-04-16 ENCOUNTER — Other Ambulatory Visit: Payer: Self-pay | Admitting: Cardiology

## 2021-04-16 DIAGNOSIS — I5020 Unspecified systolic (congestive) heart failure: Secondary | ICD-10-CM

## 2021-04-17 ENCOUNTER — Other Ambulatory Visit: Payer: Self-pay | Admitting: Cardiology

## 2021-04-17 DIAGNOSIS — I5020 Unspecified systolic (congestive) heart failure: Secondary | ICD-10-CM

## 2021-04-18 ENCOUNTER — Other Ambulatory Visit: Payer: Self-pay

## 2021-04-18 DIAGNOSIS — I5020 Unspecified systolic (congestive) heart failure: Secondary | ICD-10-CM

## 2021-04-18 MED ORDER — ENTRESTO 97-103 MG PO TABS: 1.0000 | ORAL_TABLET | Freq: Two times a day (BID) | ORAL | 5 refills | Status: DC

## 2021-04-18 MED ORDER — METOPROLOL SUCCINATE ER 50 MG PO TB24: ORAL_TABLET | ORAL | 2 refills | Status: DC

## 2021-04-22 ENCOUNTER — Ambulatory Visit (HOSPITAL_COMMUNITY): Payer: 59 | Attending: Internal Medicine

## 2021-04-22 DIAGNOSIS — I5020 Unspecified systolic (congestive) heart failure: Secondary | ICD-10-CM | POA: Diagnosis not present

## 2021-05-05 ENCOUNTER — Ambulatory Visit (INDEPENDENT_AMBULATORY_CARE_PROVIDER_SITE_OTHER): Payer: 59 | Admitting: Internal Medicine

## 2021-05-05 VITALS — Ht 74.0 in | Wt 262.0 lb

## 2021-05-05 DIAGNOSIS — I428 Other cardiomyopathies: Secondary | ICD-10-CM

## 2021-05-05 DIAGNOSIS — I447 Left bundle-branch block, unspecified: Secondary | ICD-10-CM

## 2021-05-05 DIAGNOSIS — I5022 Chronic systolic (congestive) heart failure: Secondary | ICD-10-CM

## 2021-05-05 NOTE — Patient Instructions (Signed)
Medication Instructions:  Your physician recommends that you continue on your current medications as directed. Please refer to the Current Medication list given to you today.  *If you need a refill on your cardiac medications before your next appointment, please call your pharmacy*   Lab Work: None ordered.  If you have labs (blood work) drawn today and your tests are completely normal, you will receive your results only by: MyChart Message (if you have MyChart) OR A paper copy in the mail If you have any lab test that is abnormal or we need to change your treatment, we will call you to review the results.   Testing/Procedures: None ordered.    Follow-Up: At [facility], you and your health needs are our priority.  As part of our continuing mission to provide you with exceptional heart care, we have created designated Provider Care Teams.  These Care Teams include your primary Cardiologist (physician) and Advanced Practice Providers (APPs -  Physician Assistants and Nurse Practitioners) who all work together to provide you with the care you need, when you need it.  We recommend signing up for the patient portal called "MyChart".  Sign up information is provided on this After Visit Summary.  MyChart is used to connect with patients for Virtual Visits (Telemedicine).  Patients are able to view lab/test results, encounter notes, upcoming appointments, etc.  Non-urgent messages can be sent to your provider as well.   To learn more about what you can do with MyChart, go to [email or web address].    Your next appointment:   Follow up with Dr Klein as needed  Important Information About Sugar       

## 2021-05-05 NOTE — Progress Notes (Signed)
?  ? ?Electrophysiology TeleHealth Note ? ? ?Due to national recommendations of social distancing due to COVID 19, an audio/video telehealth visit is felt to be most appropriate for this patient at this time.  See MyChart message from today for the patient's consent to telehealth for [facility]. ? ? ?Date:  05/05/2021  ? ?ID:  Gabriel Carter, DOB 10/14/1960, MRN [id number]  Location: patient's home ? ?Provider location: [address], [place] ? ?Evaluation Performed: Follow-up visit ? ?PCP:  Clovis Riley, L.August Saucer, MD  ?Cardiologist:   MPat ?Electrophysiologist:  SK  ? ?Chief Complaint:  NICM ? ?History of Present Illness:   ? ?Gabriel Carter is a 61 y.o. male who presents via audio/video conferencing for a telehealth visit today.  Since last being seen in our clinic for consideration of CRT in the setting of a nonischemic cardiomyopathy and underwent CPX to assess CHF post COVID the patient reports doing well, and the CPX report demonstrates no functional limitation, ? ?  ?DATE TEST EF    ?12/20 Stress  33 %    ?1/21 Echo  20-25 %    ?2/21 cMRI 34%    ?2/21 RHC/LHC   No obstructive CAD  ?6/21 Echo 35-40 %    ?1/22 Echo 35-40 % (Calc 45%)    ?3/23 Echo  45-50%    ?         ?  ?Date Cr K Hgb  ?2/22 0.91 4.5 13.6 (2/21)  ?10/22 0.91 4.5    ?         ?  ? ?  ? ?Past Medical History:  ?Diagnosis Date  ? Arthritis   ? Heart murmur   ? Hypertension   ? ? ?Past Surgical History:  ?Procedure Laterality Date  ? APPENDECTOMY    ? RIGHT/LEFT HEART CATH AND CORONARY ANGIOGRAPHY N/A 02/11/2019  ? Procedure: RIGHT/LEFT HEART CATH AND CORONARY ANGIOGRAPHY;  Surgeon: Elder Negus, MD;  Location: [facility] IN[facility]AS[facility] LAB;  Service: Cardiovascular;  Laterality: N/A;  ? TONSILLECTOMY    ? age 45  ? ? ?Current Outpatient Medications  ?Medication Sig Dispense Refill  ? dapagliflozin propanediol (FARXIGA) 10 MG TABS tablet Take 1 tablet (10 mg total) by mouth daily before breakfast. 90 tablet 3  ? metoprolol succinate  (TOPROL-XL) 50 MG 24 hr tablet TAKE 1 TABLET BY MOUTH DAILY WITH OR IMMEDIATELY FOLLOWING A MEAL 30 tablet 2  ? montelukast (SINGULAIR) 10 MG tablet Take 10 mg by mouth daily as needed (allergies).     ? sacubitril-valsartan (ENTRESTO) 97-103 MG Take 1 tablet by mouth 2 (two) times daily. 60 tablet 5  ? spironolactone (ALDACTONE) 25 MG tablet Take 1 tablet (25 mg total) by mouth daily. 90 tablet 3  ? furosemide (LASIX) 40 MG tablet Take 1 tablet (40 mg total) by mouth daily as needed. 90 tablet 3  ? ?No current facility-administered medications for this visit.  ? ? ?Allergies:   Erythromycin, Lactose intolerance (gi), Other, Naproxen, and Vioxx [rofecoxib]  ? ?ROS:  Please see the history of present illness.   All other systems are personally reviewed and negative.  ? ? ?Exam:   ? ?Vital Signs:  Ht 6\' 2"  (1.88 m)   Wt 262 lb (118.8 kg)   BMI 33.64 kg/m?   ? ?  ? ? ?Labs/Other Tests and Data Reviewed:   ? ?Recent Labs: ?10/12/2020: BUN 16; Creatinine, Ser 0.91; NT-Pro BNP 22; Potassium 4.5; Sodium 141  ? ?Wt Readings from Last  3 Encounters:  ?05/05/21 262 lb (118.8 kg)  ?04/06/21 266 lb (120.7 kg)  ?04/01/21 263 lb (119.3 kg)  ?  ? ?Other studies personally reviewed: ?Additional studies/ records that were reviewed today include: (As above)  ? ? ? ?ASSESSMENT & PLAN:   ? ?Nonischemic cardiomyopathy ?  ?Left bundle branch block ?  ?HFmrEF largely functionally resolved CPX without deficit ? ?In the setting of improved LV function in the absence of functional limitations, I do not think there is any indication to proceed with CRT at this time.  We will be available. ? ? ? ?COVID 19 screen ?The patient denies symptoms of COVID 19 at this time.  The importance of social distancing was discussed today. ? ?Follow-up: As needed ?  ? ?Current medicines are reviewed at length with the patient today.   ?The patient  concerns regarding his medicines.  The following changes were made today:   ? ?Labs/ tests ordered today  include:  ?No orders of the defined types were placed in this encounter. ? ? ?  months ? ?Patient Risk:  after full review of this patients clinical status, I feel that they are at moderate risk at this time. ? ?Today, I have spent 11 minutes with the patient with telehealth technology discussing the above. ? ?Signed, ?Sherryl Manges, MD  ?05/05/2021 3:42 PM    ? ?[facility] ?[address] ?[address] ?[place] [address] ?[phone] (office) ?[phone] ? ?

## 2021-06-14 IMAGING — MR MR CARD MORPHOLOGY WO/W CM
35 of 37 series · 38 of 40 positions shown · IV contrast (gadavist)
Comparison: none

CLINICAL DATA: 58-year-old male with new diagnosis of
cardiomyopathy of unknown etiology.

EXAM:
CARDIAC MRI
TECHNIQUE: The patient was scanned on a 1.5 Tesla GE magnet. A dedicated
cardiac coil was used. Functional imaging was done using Fiesta
sequences. [DATE], and 4 chamber views were done to assess for RWMA's.
Modified Rank rule using a short axis stack was used to
calculate an ejection fraction on a dedicated work station using
Circle software. The patient received 8 cc of Gadavist. After 10
minutes inversion recovery sequences were used to assess for
infiltration and scar tissue.
CONTRAST:  8 cc  of Gadavist

[Series 6: bSSFP · coronal · 8.0mm · 1.61mm/px · 1 of 25 slices shown (1 of 20)]
[im 1/25]
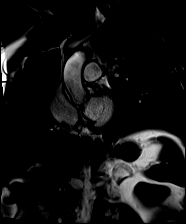

[Series 6: bSSFP · coronal · 8.0mm · 1.61mm/px · 1 of 25 slices shown (2 of 20)]
[im 1/25]
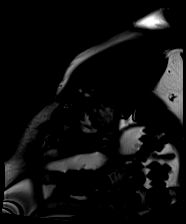

[Series 6: bSSFP · coronal · 8.0mm · 1.61mm/px · 1 of 25 slices shown (3 of 20)]
[im 1/25]
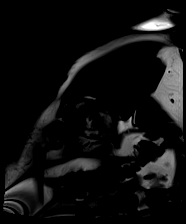

[Series 6: bSSFP · coronal · 8.0mm · 1.61mm/px · 1 of 25 slices shown (4 of 20)]
[im 1/25]
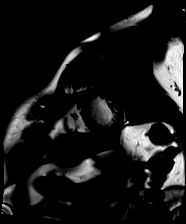

[Series 6: bSSFP · coronal · 8.0mm · 1.61mm/px · 1 of 25 slices shown (5 of 20)]
[im 1/25]
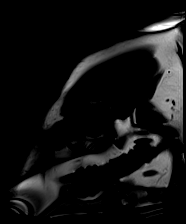

[Series 6: bSSFP · coronal · 8.0mm · 1.61mm/px · 1 of 25 slices shown (6 of 20)]
[im 1/25]
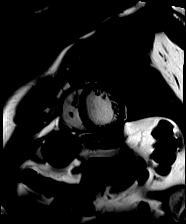

[Series 6: bSSFP · coronal · 8.0mm · 1.61mm/px · 1 of 25 slices shown (7 of 20)]
[im 1/25]
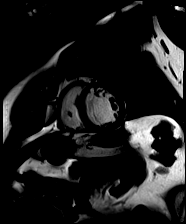

[Series 6: bSSFP · coronal · 8.0mm · 1.61mm/px · 1 of 25 slices shown (8 of 20)]
[im 1/25]
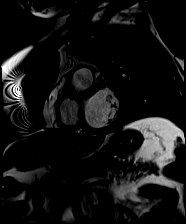

[Series 6: bSSFP · coronal · 8.0mm · 1.61mm/px · 1 of 25 slices shown (9 of 20)]
[im 1/25]
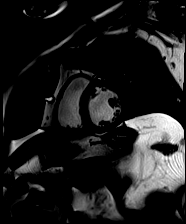

[Series 6: bSSFP · coronal · 8.0mm · 1.61mm/px · 1 of 25 slices shown (10 of 20)]
[im 1/25]
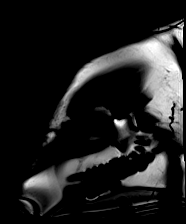

[Series 6: bSSFP · coronal · 8.0mm · 1.61mm/px · 1 of 25 slices shown (11 of 20)]
[im 1/25]
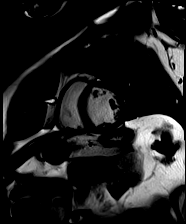

[Series 6: bSSFP · coronal · 8.0mm · 1.61mm/px · 1 of 25 slices shown (12 of 20)]
[im 1/25]
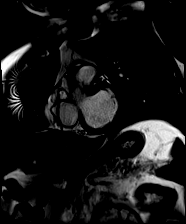

[Series 6: bSSFP · coronal · 8.0mm · 1.61mm/px · 1 of 25 slices shown (13 of 20)]
[im 1/25]
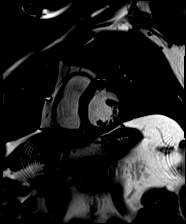

[Series 6: bSSFP · coronal · 8.0mm · 1.61mm/px · 1 of 25 slices shown (14 of 20)]
[im 1/25]
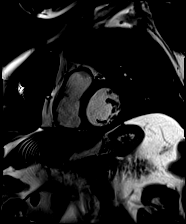

[Series 6: bSSFP · coronal · 8.0mm · 1.61mm/px · 1 of 25 slices shown (15 of 20)]
[im 1/25]
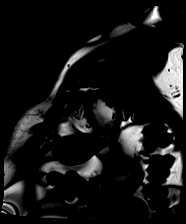

[Series 6: bSSFP · coronal · 8.0mm · 1.61mm/px · 1 of 25 slices shown (16 of 20)]
[im 1/25]
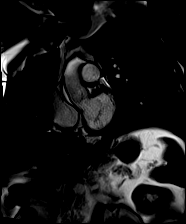

[Series 6: bSSFP · coronal · 8.0mm · 1.61mm/px · 1 of 25 slices shown (17 of 20)]
[im 1/25]
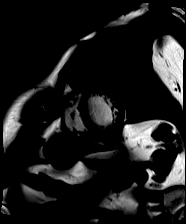

[Series 8: (id)_long_t1_moco · coronal · 8.0mm · 1.41mm/px · 1 of 24 slices shown]
[im 1/24]
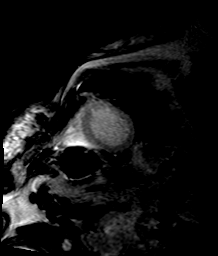

[Series 9: (id)_long_t1_moco_t1 · coronal · 8.0mm · 1.41mm/px · 1 of 6 slices shown]
[im 1/6]
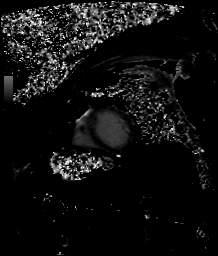

[Series 12: (id)_trufi_moco · coronal · 8.0mm · 1.88mm/px · 1 of 9 slices shown]
[im 1/9]
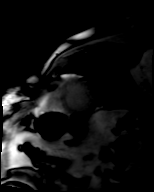

[Series 13: (id)_trufi_moco_t2 · coronal · 8.0mm · 1.88mm/px · 1 of 3 slices shown]
[im 1/3]
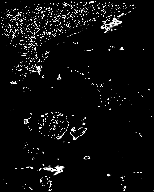

[Series 15: t2_stir_db_radial ((date)ch) · axial · 6.0mm · 1.73mm/px · 1 of 2 slices shown]
[im 1/2]
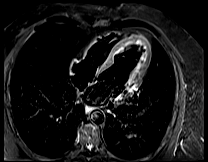

[Series 16: bSSFP · axial · 6.0mm · 1.41mm/px · 1 of 25 slices shown (18 of 20)]
[im 1/25]
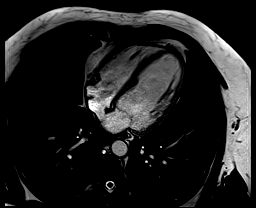

[Series 17: bSSFP · oblique · 6.0mm · 1.41mm/px · 2 of 25 slices shown (19 of 20)]
[im 1/25]
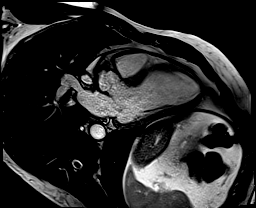
[im 25/25]
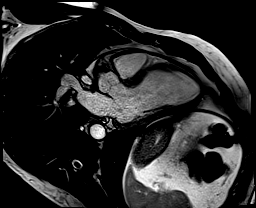

[Series 18: bSSFP · oblique · 6.0mm · 1.41mm/px · 2 of 25 slices shown (20 of 20)]
[im 1/25]
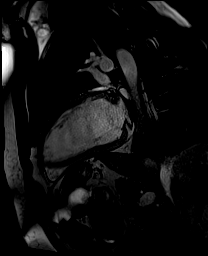
[im 25/25]
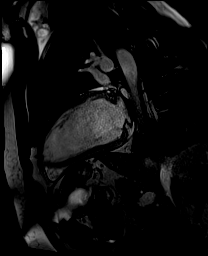

[Series 20: lge_single shot sa · coronal · 8.0mm · 1.98mm/px · 1 of 17 slices shown (1 of 2)]
[im 1/17]
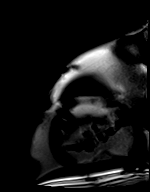

[Series 21: lge_single shot sa · coronal · 8.0mm · 1.98mm/px · 1 of 17 slices shown (2 of 2)]
[im 1/17]
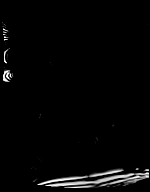

[Series 22: lge_single shot radial_mag · axial · 6.0mm · 1.98mm/px · 1 of 1 slices shown]
[im 1/1]
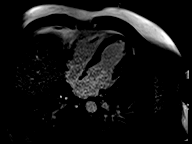

[Series 23: lge_single shot radial_psir · axial · 6.0mm · 1.98mm/px · 1 of 1 slices shown]
[im 1/1]
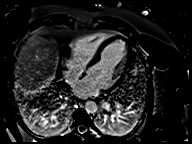

[Series 29: (id)_short_t1_moco · coronal · 8.0mm · 1.41mm/px · 2 of 27 slices shown]
[im 1/27]
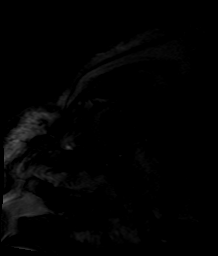
[im 27/27]
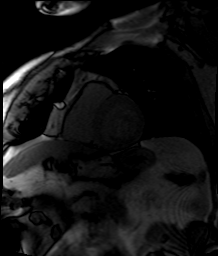

[Series 30: (id)_short_t1_moco_t1 · coronal · 8.0mm · 1.41mm/px · 1 of 6 slices shown]
[im 1/6]
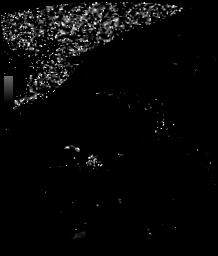

[Series 33: lge short axis_mag · coronal · 8.0mm · 1.52mm/px · 1 of 15 slices shown]
[im 1/15]
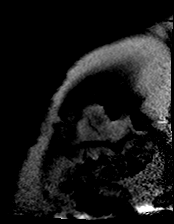

[Series 34: lge short axis_psir · coronal · 8.0mm · 1.52mm/px · 1 of 15 slices shown]
[im 1/15]
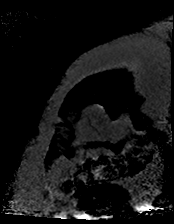

[Series 35: lge radial ((date)ch)_mag · axial · 6.0mm · 1.61mm/px · 1 of 1 slices shown]
[im 1/1]
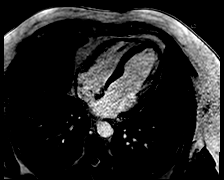

[Series 36: lge radial ((date)ch)_psir · axial · 6.0mm · 1.61mm/px · 1 of 1 slices shown]
[im 1/1]
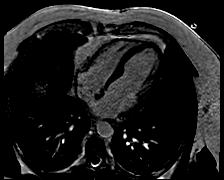

[38 of 40 positions shown; findings below may reference images not displayed]

FINDINGS: 1. Mildly dilated let ventricle with mild concentric hypertrophy and
moderately to severely decreased systolic function (LVEF = 34%).
There is pronounced paradoxical septal motion (consistent with LBBB)
and hypokinesis of the basal and mid inferior and apical anterior,
septal and inferior wall. There is midwall late gadolinium
enhancement in the baseline anteroseptal and inferoseptal walls.

LVEDD: 57 mm

LVESD: 46 mm

LVEDV: 263 ml

LVESV: 173 ml

SV: 90 ml

CO: 5.4 L/min

Myocardial mass: 182 g

2. Normal right ventricular size, thickness and systolic function
(LVEF = 56%). There are no regional wall motion abnormalities.

3.  Normal left and right atrial size.

4. Normal size of the aortic root, ascending aorta. Mildly dilated
pulmonary artery measuring 34 mm.

5.  Mild mitral and tricuspid regurgitation.

6.  Normal pericardium.  No pericardial effusion.
IMPRESSION: 1. Mildly dilated let ventricle with mild concentric hypertrophy and
moderately to severely decreased systolic function (LVEF = 34%).
There is pronounced paradoxical septal motion (consistent with LBBB)
and hypokinesis of the basal and mid inferior and apical anterior,
septal and inferior wall. There is midwall late gadolinium
enhancement in the baseline anteroseptal and inferoseptal walls
consistent with decompensated CHF/elevated LVEDP.

2. Normal right ventricular size, thickness and systolic function
(LVEF = 56%). There are no regional wall motion abnormalities.

3.  Normal left and right atrial size.

4. Mildly dilated pulmonary artery measuring 34 mm suggestive of
pulmonary hypertension.

5.  Mild mitral and tricuspid regurgitation.

Collectively, these findings are suggestive of dilated non-ischemic
cardiomyopathy. There is no scarring, no signs of infiltrative of
inflammatory cardiomyopathy.

## 2021-07-21 ENCOUNTER — Other Ambulatory Visit: Payer: Self-pay | Admitting: Cardiology

## 2021-07-21 ENCOUNTER — Other Ambulatory Visit: Payer: Self-pay

## 2021-07-21 ENCOUNTER — Other Ambulatory Visit: Payer: Self-pay | Admitting: Internal Medicine

## 2021-07-21 DIAGNOSIS — I5020 Unspecified systolic (congestive) heart failure: Secondary | ICD-10-CM

## 2021-07-21 MED ORDER — METOPROLOL SUCCINATE ER 50 MG PO TB24: ORAL_TABLET | ORAL | 3 refills | Status: DC

## 2021-07-21 MED ORDER — DAPAGLIFLOZIN PROPANEDIOL 10 MG PO TABS: 10.0000 mg | ORAL_TABLET | Freq: Every day | ORAL | 3 refills | Status: DC

## 2021-07-22 MED ORDER — SPIRONOLACTONE 25 MG PO TABS: 25.0000 mg | ORAL_TABLET | Freq: Every day | ORAL | 3 refills | Status: DC

## 2021-08-14 ENCOUNTER — Other Ambulatory Visit: Payer: Self-pay | Admitting: Cardiology

## 2021-10-06 ENCOUNTER — Ambulatory Visit: Payer: 59 | Admitting: Cardiology

## 2021-10-06 ENCOUNTER — Encounter: Payer: Self-pay | Admitting: Cardiology

## 2021-10-06 VITALS — BP 147/76 | HR 85 | Temp 98.2°F | Resp 16 | Ht 74.0 in | Wt 236.0 lb

## 2021-10-06 DIAGNOSIS — I428 Other cardiomyopathies: Secondary | ICD-10-CM | POA: Diagnosis not present

## 2021-10-06 DIAGNOSIS — I447 Left bundle-branch block, unspecified: Secondary | ICD-10-CM

## 2021-10-06 DIAGNOSIS — I5022 Chronic systolic (congestive) heart failure: Secondary | ICD-10-CM | POA: Insufficient documentation

## 2021-10-06 DIAGNOSIS — I10 Essential (primary) hypertension: Secondary | ICD-10-CM | POA: Diagnosis not present

## 2021-10-06 MED ORDER — BISOPROLOL FUMARATE 5 MG PO TABS: 2.5000 mg | ORAL_TABLET | Freq: Every day | ORAL | 2 refills | Status: DC

## 2021-10-06 MED ORDER — EMPAGLIFLOZIN 10 MG PO TABS: 10.0000 mg | ORAL_TABLET | Freq: Every day | ORAL | 2 refills | Status: DC

## 2021-10-06 NOTE — Progress Notes (Signed)
Patient referred by [name], L.Marlou Sa, MD for exertional dyspnea  Subjective:   Gabriel Carter, male    DOB: 1960-10-09, 61 y.o.   MRN: [id number]    Chief Complaint  Patient presents with   Congestive Heart Failure   Follow-up    6 month   Cardiomyopathy     HPI  61 y.o. male with hypertension, nonischemic cardiomyopathy.  Patient continues to have class II exertional dyspnea, as well as leg edema symptoms.  He is taking Lasix on the weekends, twice a day.  He was thought not to be a candidate for CRT-PE, as his LVEF has in fact improved.  He is not taking [place], due to cost issues.   Current Outpatient Medications:    furosemide (LASIX) 40 MG tablet, Take 1 tablet (40 mg total) by mouth daily as needed., Disp: 90 tablet, Rfl: 3   metoprolol succinate (TOPROL-XL) 50 MG 24 hr tablet, TAKE 1 TABLET BY MOUTH DAILY WITH OR IMMEDIATELY FOLLOWING A MEAL, Disp: 90 tablet, Rfl: 3   montelukast (SINGULAIR) 10 MG tablet, Take 10 mg by mouth daily as needed (allergies). , Disp: , Rfl:    sacubitril-valsartan (ENTRESTO) 97-103 MG, Take 1 tablet by mouth 2 (two) times daily., Disp: 60 tablet, Rfl: 5   spironolactone (ALDACTONE) 50 MG tablet, TAKE 1 TABLET BY MOUTH DAILY, Disp: 30 tablet, Rfl: 5   dapagliflozin propanediol (FARXIGA) 10 MG TABS tablet, Take 1 tablet (10 mg total) by mouth daily before breakfast. (Patient not taking: Reported on 10/06/2021), Disp: 90 tablet, Rfl: 3    Cardiovascular and other pertinent studies:  EKG 10/06/2021: Sinus rhythm 81 bpm Left bundle branch block  Echocardiogram 03/07/2021:  Left ventricle cavity is normal in size. Mild concentric hypertrophy of  the left ventricle. Abnormal and hypokinetic septal wall motion due to  left bundle branch block. Mildly depressed LV systolic function with  visual EF 45-50%. Doppler evidence of grade I (impaired) diastolic  dysfunction, normal LAP.  No significant valvular abnormality.  Compared to previous  study on 01/16/2020, LVEF has improved from 35-40%.   Cardiac MRI 02/07/2019: 1. Mildly dilated let ventricle with mild concentric hypertrophy and moderately to severely decreased systolic function (LVEF = 34%). There is pronounced paradoxical septal motion (consistent with LBBB) and hypokinesis of the basal and mid inferior and apical anterior, septal and inferior wall. There is midwall late gadolinium enhancement in the baseline anteroseptal and inferoseptal walls consistent with decompensated CHF/elevated LVEDP. 2. Normal right ventricular size, thickness and systolic function (LVEF = 56%). There are no regional wall motion abnormalities. 3.  Normal left and right atrial size. 4. Mildly dilated pulmonary artery measuring 34 mm suggestive of pulmonary hypertension. 5.  Mild mitral and tricuspid regurgitation.   Collectively, these findings are suggestive of dilated non-ischemic cardiomyopathy. There is no scarring, no signs of infiltrative of inflammatory cardiomyopathy.   RHC/LHC/Coronary angiography 02/11/2019: LM: Normal LAD: Normal LCx: Normal RCA: Normal   RA: 3 mmHg RV: 31/0 mmHg  PA: 21/6 mmHg, mean PA 11 mmHg PCW: 11 mmHg LVEDP 6 mmHg CO: 7.4 L/min CI: 3 L/min/m2   Compensated nonischemic cardiomyopathy  Recent labs: 10/12/2020: Glucose 109, BUN/Cr 16/0.91. EGFR >90. Na/K 141/4.5. Rest of the CMP normal NT pro BNP 22 normal  02/03/2020: Glucose 95, BUN/Cr 11/0.91. EGFR 91. Na/K 143/4.5.  H/H 13/40  07/04/2019: Glucose 170, BUN/Cr 13/1.0. EGFR 82. Na/K 141/4.3. Rest of the CMP normal  02/11/2019: Na/K 141/3.8.  H/H 13.6/40. PLT 277.   02/07/2019:  Glucose 101, BUN/Cr 15/1.01. EGFR 81. Na/K 143/5.3.   01/06/2019: HbA1C 6.1% Chol 171, TG 93, HDL 53, LDL 101 TSH 0.9 normal  Review of Systems  Cardiovascular:  Positive for dyspnea on exertion and leg swelling. Negative for chest pain, palpitations and syncope.        Vitals:   10/06/21 1403 10/06/21 1409  BP: (!)  151/80 (!) 147/76  Pulse: 89 85  Resp: 16   Temp: 98.2 F (36.8 C)   SpO2: 96%      Body mass index is 30.3 kg/m. Filed Weights   10/06/21 1403  Weight: 236 lb (107 kg)     Objective:   Physical Exam Vitals and nursing note reviewed.  Constitutional:      General: He is not in acute distress.    Appearance: He is well-developed.  Pulmonary:     Effort: Pulmonary effort is normal.  Musculoskeletal:     Right lower leg: Edema (1+) present.     Left lower leg: Edema (1+) present.  Neurological:     Mental Status: He is alert and oriented to person, place, and time.      ICD-10-CM   1. Nonischemic cardiomyopathy (HCC)  I42.8 EKG 12-Lead    empagliflozin (JARDIANCE) 10 MG TABS tablet    Basic metabolic panel    Pro b natriuretic peptide (BNP)[facility] [facility])    bisoprolol (ZEBETA) 5 MG tablet    PCV ECHOCARDIOGRAM COMPLETE    2. LBBB (left bundle branch block)  I44.7     3. Chronic systolic heart failure (HCC)  I50.22     4. Essential hypertension  I10           Meds ordered this encounter  Medications   empagliflozin (JARDIANCE) 10 MG TABS tablet    Sig: Take 1 tablet (10 mg total) by mouth daily before breakfast.    Dispense:  30 tablet    Refill:  2   bisoprolol (ZEBETA) 5 MG tablet    Sig: Take 0.5 tablets (2.5 mg total) by mouth daily.    Dispense:  30 tablet    Refill:  2    Assessment & Recommendations:   61 y.o. male with hypertension, nonischemic cardiomyopathy.  Heart failure with reduced ejection fraction: Nonischemic cardiomyopathy, LVEF 45 to 50% (03/2021).   NYHA class II symptoms. LBBB >150 ms Continue Entresto 97-103 mg bid, spironolactone 50 mg daily, metoprolol succinate 50 mg daily. Added Jardiance 10 mg daily.  In addition, he can take Lasix 40 mg daily as needed.   Repeat echocardiogram in 4 weeks.  Hypertension: Well-controlled   F/u in 6 weeks  Donalee Gaumond Esther Hardy, MD [facility] Pager:  [phone] Office: [phone]

## 2021-10-28 ENCOUNTER — Ambulatory Visit: Payer: 59

## 2021-10-28 DIAGNOSIS — I428 Other cardiomyopathies: Secondary | ICD-10-CM

## 2021-10-30 ENCOUNTER — Other Ambulatory Visit: Payer: Self-pay | Admitting: Cardiology

## 2021-10-30 DIAGNOSIS — I5020 Unspecified systolic (congestive) heart failure: Secondary | ICD-10-CM

## 2021-11-09 ENCOUNTER — Other Ambulatory Visit: Payer: Self-pay | Admitting: Cardiology

## 2021-11-09 ENCOUNTER — Other Ambulatory Visit: Payer: Self-pay

## 2021-11-09 DIAGNOSIS — I428 Other cardiomyopathies: Secondary | ICD-10-CM

## 2021-11-09 DIAGNOSIS — I5020 Unspecified systolic (congestive) heart failure: Secondary | ICD-10-CM

## 2021-11-09 MED ORDER — EMPAGLIFLOZIN 10 MG PO TABS: 10.0000 mg | ORAL_TABLET | Freq: Every day | ORAL | 2 refills | Status: DC

## 2021-11-09 MED ORDER — ENTRESTO 97-103 MG PO TABS: 1.0000 | ORAL_TABLET | Freq: Two times a day (BID) | ORAL | 5 refills | Status: DC

## 2021-11-10 ENCOUNTER — Other Ambulatory Visit: Payer: Self-pay

## 2021-11-10 DIAGNOSIS — I428 Other cardiomyopathies: Secondary | ICD-10-CM

## 2021-11-10 MED ORDER — EMPAGLIFLOZIN 10 MG PO TABS: 10.0000 mg | ORAL_TABLET | Freq: Every day | ORAL | 2 refills | Status: DC

## 2021-11-18 ENCOUNTER — Ambulatory Visit: Payer: 59 | Admitting: Cardiology

## 2021-11-18 ENCOUNTER — Encounter: Payer: Self-pay | Admitting: Cardiology

## 2021-11-18 VITALS — BP 120/73 | HR 71 | Resp 15 | Ht 74.0 in | Wt 265.6 lb

## 2021-11-18 DIAGNOSIS — I5022 Chronic systolic (congestive) heart failure: Secondary | ICD-10-CM

## 2021-11-18 DIAGNOSIS — I428 Other cardiomyopathies: Secondary | ICD-10-CM

## 2021-11-18 NOTE — Progress Notes (Signed)
Patient referred by [name], L.Marlou Sa, MD for exertional dyspnea  Subjective:   Gabriel Carter, male    DOB: 16-Sep-1960, 61 y.o.   MRN: [id number]    Chief Complaint  Patient presents with   Congestive Heart Failure   Follow-up    1 month     HPI  61 y.o. male with hypertension, nonischemic cardiomyopathy.  Patient is doing better. Dyspnea and leg edema have improved. Reviewed recent test results with the patient, details below.     Current Outpatient Medications:    bisoprolol (ZEBETA) 5 MG tablet, Take 0.5 tablets (2.5 mg total) by mouth daily., Disp: 30 tablet, Rfl: 2   empagliflozin (JARDIANCE) 10 MG TABS tablet, Take 1 tablet (10 mg total) by mouth daily before breakfast., Disp: 90 tablet, Rfl: 2   furosemide (LASIX) 40 MG tablet, Take 1 tablet (40 mg total) by mouth daily as needed., Disp: 90 tablet, Rfl: 3   montelukast (SINGULAIR) 10 MG tablet, Take 10 mg by mouth daily as needed (allergies). , Disp: , Rfl:    sacubitril-valsartan (ENTRESTO) 97-103 MG, Take 1 tablet by mouth 2 (two) times daily., Disp: 60 tablet, Rfl: 5   spironolactone (ALDACTONE) 50 MG tablet, TAKE 1 TABLET BY MOUTH DAILY, Disp: 30 tablet, Rfl: 5    Cardiovascular and other pertinent studies:  Echocardiogram 10/28/2021: Normal LV systolic function with visual EF 55-60%. Left ventricle cavity is normal in size. Normal left ventricular wall thickness. Normal global wall motion. Abnormal septal wall motion due to left bundle branch block. Normal diastolic filling pattern, normal LAP. No significant valvular heart disease. Compared to 03/07/2021 LVEF improved from 45-50% to 55-60%, G1DD is now normal, otherwise no significant change.   EKG 10/06/2021: Sinus rhythm 81 bpm Left bundle branch block  Echocardiogram 03/07/2021:  Left ventricle cavity is normal in size. Mild concentric hypertrophy of  the left ventricle. Abnormal and hypokinetic septal wall motion due to  left bundle branch  block. Mildly depressed LV systolic function with  visual EF 45-50%. Doppler evidence of grade I (impaired) diastolic  dysfunction, normal LAP.  No significant valvular abnormality.  Compared to previous study on 01/16/2020, LVEF has improved from 35-40%.   Cardiac MRI 02/07/2019: 1. Mildly dilated let ventricle with mild concentric hypertrophy and moderately to severely decreased systolic function (LVEF = 34%). There is pronounced paradoxical septal motion (consistent with LBBB) and hypokinesis of the basal and mid inferior and apical anterior, septal and inferior wall. There is midwall late gadolinium enhancement in the baseline anteroseptal and inferoseptal walls consistent with decompensated CHF/elevated LVEDP. 2. Normal right ventricular size, thickness and systolic function (LVEF = 56%). There are no regional wall motion abnormalities. 3.  Normal left and right atrial size. 4. Mildly dilated pulmonary artery measuring 34 mm suggestive of pulmonary hypertension. 5.  Mild mitral and tricuspid regurgitation.   Collectively, these findings are suggestive of dilated non-ischemic cardiomyopathy. There is no scarring, no signs of infiltrative of inflammatory cardiomyopathy.   RHC/LHC/Coronary angiography 02/11/2019: LM: Normal LAD: Normal LCx: Normal RCA: Normal   RA: 3 mmHg RV: 31/0 mmHg  PA: 21/6 mmHg, mean PA 11 mmHg PCW: 11 mmHg LVEDP 6 mmHg CO: 7.4 L/min CI: 3 L/min/m2   Compensated nonischemic cardiomyopathy  Recent labs: 10/12/2020: Glucose 109, BUN/Cr 16/0.91. EGFR >90. Na/K 141/4.5. Rest of the CMP normal NT pro BNP 22 normal  02/03/2020: Glucose 95, BUN/Cr 11/0.91. EGFR 91. Na/K 143/4.5.  H/H 13/40  07/04/2019: Glucose 170, BUN/Cr 13/1.0. EGFR 82. Na/K  141/4.3. Rest of the CMP normal  02/11/2019: Na/K 141/3.8.  H/H 13.6/40. PLT 277.   02/07/2019: Glucose 101, BUN/Cr 15/1.01. EGFR 81. Na/K 143/5.3.   01/06/2019: HbA1C 6.1% Chol 171, TG 93, HDL 53, LDL 101 TSH 0.9  normal  Review of Systems  Cardiovascular:  Positive for dyspnea on exertion and leg swelling. Negative for chest pain, palpitations and syncope.        Vitals:   11/18/21 1428  BP: 120/73  Pulse: 71  Resp: 15  SpO2: 98%     Body mass index is 34.1 kg/m. Filed Weights   11/18/21 1428  Weight: 265 lb 9.6 oz (120.5 kg)     Objective:   Physical Exam Vitals and nursing note reviewed.  Constitutional:      General: He is not in acute distress.    Appearance: He is well-developed.  Pulmonary:     Effort: Pulmonary effort is normal.  Musculoskeletal:     Right lower leg: Edema (Trace) present.     Left lower leg: Edema (Trace) present.  Neurological:     Mental Status: He is alert and oriented to person, place, and time.      ICD-10-CM   1. Nonischemic cardiomyopathy (HCC)  I42.8     2. Chronic systolic heart failure (HCC)  I50.22        Assessment & Recommendations:   61 y.o. male with hypertension, nonischemic cardiomyopathy.  Heart failure with reduced ejection fraction: Nonischemic cardiomyopathy, LVEF 45 to 50% (03/2021).   NYHA class II symptoms. LBBB >150 ms Continue Entresto 97-103 mg bid, spironolactone 50 mg daily, metoprolol succinate 50 mg daily, Jardiance 10 mg daily.  In addition, he can take Lasix 40 mg daily as needed.    Hypertension: Well-controlled   F/u in 6 months   Nigel Mormon, MD Pager: [phone] Office: [phone]

## 2022-01-06 ENCOUNTER — Other Ambulatory Visit: Payer: Self-pay | Admitting: Cardiology

## 2022-01-06 DIAGNOSIS — I5020 Unspecified systolic (congestive) heart failure: Secondary | ICD-10-CM

## 2022-03-11 ENCOUNTER — Other Ambulatory Visit: Payer: Self-pay | Admitting: Cardiology

## 2022-04-12 ENCOUNTER — Other Ambulatory Visit: Payer: Self-pay | Admitting: Cardiology

## 2022-04-12 DIAGNOSIS — I428 Other cardiomyopathies: Secondary | ICD-10-CM

## 2022-05-16 ENCOUNTER — Other Ambulatory Visit: Payer: Self-pay

## 2022-05-16 DIAGNOSIS — I5020 Unspecified systolic (congestive) heart failure: Secondary | ICD-10-CM

## 2022-05-16 DIAGNOSIS — I428 Other cardiomyopathies: Secondary | ICD-10-CM

## 2022-05-16 MED ORDER — FUROSEMIDE 40 MG PO TABS
40.0000 mg | ORAL_TABLET | Freq: Every day | ORAL | 3 refills | Status: DC | PRN
Start: 2022-05-16 — End: 2023-03-28

## 2022-05-16 MED ORDER — ENTRESTO 97-103 MG PO TABS
1.0000 | ORAL_TABLET | Freq: Two times a day (BID) | ORAL | 3 refills | Status: DC
Start: 2022-05-16 — End: 2023-03-28

## 2022-05-16 MED ORDER — EMPAGLIFLOZIN 10 MG PO TABS
10.0000 mg | ORAL_TABLET | Freq: Every day | ORAL | 3 refills | Status: DC
Start: 2022-05-16 — End: 2023-03-28

## 2022-05-16 MED ORDER — BISOPROLOL FUMARATE 5 MG PO TABS
2.5000 mg | ORAL_TABLET | Freq: Every day | ORAL | 3 refills | Status: DC
Start: 2022-05-16 — End: 2023-03-28

## 2022-05-16 MED ORDER — SPIRONOLACTONE 50 MG PO TABS: 50.0000 mg | ORAL_TABLET | Freq: Every day | ORAL | 3 refills | Status: DC

## 2022-05-19 ENCOUNTER — Ambulatory Visit: Payer: 59 | Admitting: Cardiology

## 2022-06-09 ENCOUNTER — Ambulatory Visit: Payer: 59 | Admitting: Cardiology

## 2022-08-19 ENCOUNTER — Other Ambulatory Visit: Payer: Self-pay | Admitting: Cardiology

## 2022-08-19 DIAGNOSIS — I5020 Unspecified systolic (congestive) heart failure: Secondary | ICD-10-CM

## 2022-09-08 DIAGNOSIS — H52203 Unspecified astigmatism, bilateral: Secondary | ICD-10-CM | POA: Diagnosis not present

## 2022-10-13 ENCOUNTER — Ambulatory Visit: Payer: Self-pay | Admitting: Cardiology

## 2022-11-23 ENCOUNTER — Telehealth: Payer: Self-pay

## 2022-11-23 ENCOUNTER — Other Ambulatory Visit (HOSPITAL_COMMUNITY): Payer: Self-pay

## 2022-11-23 NOTE — Telephone Encounter (Signed)
Pharmacy Patient Advocate Encounter   Received notification from CoverMyMeds that prior authorization for JARDIANCE is required/requested.   Insurance verification completed.   The patient is insured through [facility] .   Per test claim: PA required; PA submitted to above mentioned insurance via CoverMyMeds Key/confirmation #/EOC [facility] Status is pending

## 2022-11-27 ENCOUNTER — Other Ambulatory Visit (HOSPITAL_COMMUNITY): Payer: Self-pay

## 2022-11-27 NOTE — Telephone Encounter (Signed)
Pharmacy Patient Advocate Encounter  Received notification from [facility] that Prior Authorization for JARDIANCE has been APPROVED from 11/23/22 to 11/23/23

## 2023-03-12 ENCOUNTER — Ambulatory Visit: Payer: Self-pay | Admitting: Cardiology

## 2023-03-28 ENCOUNTER — Ambulatory Visit: Payer: Self-pay | Attending: Cardiology | Admitting: Cardiology

## 2023-03-28 ENCOUNTER — Encounter: Payer: Self-pay | Admitting: Cardiology

## 2023-03-28 VITALS — BP 115/78 | HR 59 | Resp 16 | Ht 74.0 in | Wt 258.4 lb

## 2023-03-28 DIAGNOSIS — R7303 Prediabetes: Secondary | ICD-10-CM | POA: Diagnosis not present

## 2023-03-28 DIAGNOSIS — Z13220 Encounter for screening for lipoid disorders: Secondary | ICD-10-CM | POA: Diagnosis not present

## 2023-03-28 DIAGNOSIS — I5020 Unspecified systolic (congestive) heart failure: Secondary | ICD-10-CM

## 2023-03-28 DIAGNOSIS — I428 Other cardiomyopathies: Secondary | ICD-10-CM | POA: Diagnosis not present

## 2023-03-28 LAB — LIPID PANEL
Chol/HDL Ratio: 3.3 ratio (ref 0.0–5.0)
Cholesterol, Total: 187 mg/dL (ref 100–199)
HDL: 56 mg/dL (ref >39)
LDL Chol Calc (NIH): 114 mg/dL — ABNORMAL HIGH (ref 0–99)
Triglycerides: 91 mg/dL (ref 0–149)
VLDL Cholesterol Cal: 17 mg/dL (ref 5–40)

## 2023-03-28 LAB — CBC

## 2023-03-28 MED ORDER — SPIRONOLACTONE 50 MG PO TABS: 50.0000 mg | ORAL_TABLET | Freq: Every day | ORAL | 3 refills | Status: AC

## 2023-03-28 MED ORDER — EMPAGLIFLOZIN 10 MG PO TABS: 10.0000 mg | ORAL_TABLET | Freq: Every day | ORAL | 3 refills | Status: AC

## 2023-03-28 MED ORDER — FUROSEMIDE 40 MG PO TABS: 40.0000 mg | ORAL_TABLET | Freq: Every day | ORAL | 3 refills | Status: DC | PRN

## 2023-03-28 MED ORDER — BISOPROLOL FUMARATE 5 MG PO TABS: 2.5000 mg | ORAL_TABLET | Freq: Every day | ORAL | 3 refills | Status: AC

## 2023-03-28 MED ORDER — ENTRESTO 97-103 MG PO TABS: 1.0000 | ORAL_TABLET | Freq: Two times a day (BID) | ORAL | 3 refills | Status: AC

## 2023-03-28 NOTE — Progress Notes (Signed)
 Cardiology Office Note:  .   Date:  03/28/2023  ID:  Gabriel Carter, DOB 10/10/1960, MRN [id number] PCP: Asencion Gowda.August Saucer, MD (Inactive)  [facility] Providers Cardiologist:  Truett Mainland, MD PCP: Clovis Riley, Elbert Ewings.August Saucer, MD (Inactive)  Chief Complaint  Patient presents with   heart failure with reduced ejection fraction   Nonischemic cardiomyopathy   Follow-up     Gabriel Carter is a 63 y.o. male with hypertension, nonischemic cardiomyopathy with recovered LVEF.  Patient is doing well, denies any exertional dyspnea, orthopnea, edema symptoms.  Leg swelling persists, but he has not taken Lasix in over a year.  He does not currently see a PCP as his prior PCP retired.  He is in the process of establishing care with a new PCP.     Vitals:   03/28/23 0917  BP: 115/78  Pulse: (!) 59  Resp: 16  SpO2: 98%      Review of Systems  Cardiovascular:  Positive for leg swelling. Negative for chest pain, dyspnea on exertion, palpitations and syncope.        Studies Reviewed: Marland Kitchen        EKG 03/28/2023: Sinus rhythm with 1st degree A-V block Left bundle branch block No significant change compared to previous EKG in 11/2021.  Echocardiogram 10/28/2021: Normal LV systolic function with visual EF 55-60%. Left ventricle cavity is normal in size. Normal left ventricular wall thickness. Normal global wall motion. Abnormal septal wall motion due to left bundle branch block. Normal diastolic filling pattern, normal LAP. No significant valvular heart disease. Compared to 03/07/2021 LVEF improved from 45-50% to 55-60%, G1DD is now normal, otherwise no significant change.    Physical Exam Vitals and nursing note reviewed.  Constitutional:      General: He is not in acute distress. Neck:     Vascular: No JVD.  Cardiovascular:     Rate and Rhythm: Normal rate and regular rhythm.     Heart sounds: Normal heart sounds. No murmur heard. Pulmonary:     Effort: Pulmonary  effort is normal.     Breath sounds: Normal breath sounds. No wheezing or rales.  Musculoskeletal:     Right lower leg: Edema (1+) present.     Left lower leg: Edema (1+) present.      VISIT DIAGNOSES:   ICD-10-CM   1. Nonischemic cardiomyopathy (HCC)  I42.8 EKG 12-Lead    2. Prediabetes  R73.03     3. Screening for cholesterol level  Z13.220        Gabriel Carter is a 63 y.o. male with hypertension, nonischemic cardiomyopathy.   Nonischemic cardiomyopathy: LVEF recovered to 55 to 60% (11/2021).   Continue Entresto 97-103 mg bid, spironolactone 50 mg daily, metoprolol succinate 50 mg daily, Jardiance 10 mg daily.  In addition, he can take Lasix 40 mg daily as needed.  Medications refilled today. Will check CBC, BMP, proBNP today. I suspect his leg swelling is due to venous insufficiency.  Recommend compression stockings and leg elevation. If proBNP is elevated, will repeat echocardiogram.   Prediabetes: He currently does not see a PCP.  Will check A1c and lipid panel.    Meds ordered this encounter  Medications   bisoprolol (ZEBETA) 5 MG tablet    Sig: Take 0.5 tablets (2.5 mg total) by mouth daily.    Dispense:  45 tablet    Refill:  3   empagliflozin (JARDIANCE) 10 MG TABS tablet    Sig: Take 1 tablet (10 mg total) by  mouth daily before breakfast.    Dispense:  90 tablet    Refill:  3    Med approved 11/09/2021 to 11/10/2022   furosemide (LASIX) 40 MG tablet    Sig: Take 1 tablet (40 mg total) by mouth daily as needed.    Dispense:  30 tablet    Refill:  3   sacubitril-valsartan (ENTRESTO) 97-103 MG    Sig: Take 1 tablet by mouth 2 (two) times daily.    Dispense:  180 tablet    Refill:  3    DX Code Needed  .   spironolactone (ALDACTONE) 50 MG tablet    Sig: Take 1 tablet (50 mg total) by mouth daily.    Dispense:  90 tablet    Refill:  3     F/u in 1 year  Signed, Elder Negus, MD

## 2023-03-28 NOTE — Patient Instructions (Signed)
 Medication Instructions:  Refills placed today   *If you need a refill on your cardiac medications before your next appointment, please call your pharmacy*   Lab Work: CBC BMP PROBNP HGB A1C LIPID PANEL   If you have labs (blood work) drawn today and your tests are completely normal, you will receive your results only by: MyChart Message (if you have MyChart) OR A paper copy in the mail If you have any lab test that is abnormal or we need to change your treatment, we will call you to review the results.   Follow-Up: At [facility], you and your health needs are our priority.  As part of our continuing mission to provide you with exceptional heart care, we have created designated Provider Care Teams.  These Care Teams include your primary Cardiologist (physician) and Advanced Practice Providers (APPs -  Physician Assistants and Nurse Practitioners) who all work together to provide you with the care you need, when you need it.  We recommend signing up for the patient portal called "MyChart".  Sign up information is provided on this After Visit Summary.  MyChart is used to connect with patients for Virtual Visits (Telemedicine).  Patients are able to view lab/test results, encounter notes, upcoming appointments, etc.  Non-urgent messages can be sent to your provider as well.   To learn more about what you can do with MyChart, go to [email or web address].    Your next appointment:   1 year(s)  Provider:   Elder Negus, MD     Other Instructions   1st Floor: - Lobby - Registration  - Pharmacy  - Lab - Cafe  2nd Floor: - PV Lab - Diagnostic Testing (echo, CT, nuclear med)  3rd Floor: - Vacant  4th Floor: - TCTS (cardiothoracic surgery) - AFib Clinic - Structural Heart Clinic - Vascular Surgery  - Vascular Ultrasound  5th Floor: - HeartCare Cardiology (general and EP) - Clinical Pharmacy for coumadin, hypertension, lipid, weight-loss medications,  and med management appointments    Valet parking services will be available as well.

## 2023-03-29 ENCOUNTER — Other Ambulatory Visit: Payer: Self-pay

## 2023-03-29 DIAGNOSIS — E782 Mixed hyperlipidemia: Secondary | ICD-10-CM

## 2023-03-29 LAB — CBC
Hematocrit: 44.9 % (ref 37.5–51.0)
Hemoglobin: 15.5 g/dL (ref 13.0–17.7)
MCH: 34.4 pg — ABNORMAL HIGH (ref 26.6–33.0)
MCHC: 34.5 g/dL (ref 31.5–35.7)
MCV: 100 fL — ABNORMAL HIGH (ref 79–97)
Platelets: 261 10*3/uL (ref 150–450)
RBC: 4.51 x10E6/uL (ref 4.14–5.80)
RDW: 11.7 % (ref 11.6–15.4)
WBC: 6.4 10*3/uL (ref 3.4–10.8)

## 2023-03-29 LAB — PRO B NATRIURETIC PEPTIDE: NT-Pro BNP: <36 pg/mL (ref 0–210)

## 2023-03-29 LAB — BASIC METABOLIC PANEL WITH GFR
BUN/Creatinine Ratio: 14 (ref 10–24)
BUN: 13 mg/dL (ref 8–27)
CO2: 24 mmol/L (ref 20–29)
Calcium: 9.4 mg/dL (ref 8.6–10.2)
Chloride: 101 mmol/L (ref 96–106)
Creatinine, Ser: 0.96 mg/dL (ref 0.76–1.27)
Glucose: 110 mg/dL — ABNORMAL HIGH (ref 70–99)
Potassium: 4.9 mmol/L (ref 3.5–5.2)
Sodium: 139 mmol/L (ref 134–144)
eGFR: 89 mL/min/{1.73_m2} (ref >59)

## 2023-03-29 LAB — LIPID PANEL
Chol/HDL Ratio: 3.2 ratio (ref 0.0–5.0)
Cholesterol, Total: 187 mg/dL (ref 100–199)
HDL: 59 mg/dL (ref >39)
LDL Chol Calc (NIH): 111 mg/dL — ABNORMAL HIGH (ref 0–99)
Triglycerides: 95 mg/dL (ref 0–149)
VLDL Cholesterol Cal: 17 mg/dL (ref 5–40)

## 2023-03-29 LAB — HEMOGLOBIN A1C
Est. average glucose Bld gHb Est-mCnc: 137 mg/dL
Hgb A1c MFr Bld: 6.4 % — ABNORMAL HIGH (ref 4.8–5.6)

## 2023-03-29 MED ORDER — ROSUVASTATIN CALCIUM 10 MG PO TABS: 10.0000 mg | ORAL_TABLET | Freq: Every day | ORAL | 3 refills | Status: AC

## 2023-03-29 NOTE — Progress Notes (Signed)
 There is slight increase in LDL compared to previous years.  Based on blood pressure and cholesterol numbers, continue ASCVD risk is 9.1%.  Generally, statin if the risk is >7.5%.  In addition to heart of the diet lifestyle, recommend Crestor 10 mg daily.  This is especially more important given that A1c is also increased from 6.1% to 6.4%, still in prediabetic range but fairly close to diagnosis of diabetes which is 6.5%.  If patient is not sure about starting statin at this time, consider calcium score scan for further restratification.  If patient starts statin now, repeat lipid panel in 3 months to look for improvement.  Separately, also recommend establishing care with PCP to consider treatment of prediabetes with metformin.  Thanks MJP

## 2023-08-02 ENCOUNTER — Other Ambulatory Visit: Payer: Self-pay | Admitting: Cardiology

## 2023-08-02 DIAGNOSIS — I5020 Unspecified systolic (congestive) heart failure: Secondary | ICD-10-CM

## 2023-12-12 ENCOUNTER — Other Ambulatory Visit: Payer: Self-pay | Admitting: Cardiology

## 2023-12-12 DIAGNOSIS — I5020 Unspecified systolic (congestive) heart failure: Secondary | ICD-10-CM

## 2023-12-13 ENCOUNTER — Other Ambulatory Visit (HOSPITAL_COMMUNITY): Payer: Self-pay

## 2023-12-13 ENCOUNTER — Telehealth: Payer: Self-pay | Admitting: Pharmacy Technician

## 2023-12-13 NOTE — Telephone Encounter (Signed)
 Pharmacy Patient Advocate Encounter   Received notification from CoverMyMeds that prior authorization for Jardiance  10 MG is required/requested.   Insurance verification completed.   The patient is insured through [employer].   Per test claim: PA required; PA submitted to above mentioned insurance via Latent Key/confirmation #/EOC [id number] Status is pending

## 2023-12-13 NOTE — Telephone Encounter (Signed)
 Pharmacy Patient Advocate Encounter  Received notification from [employer] that Prior Authorization for Jardiance  has been APPROVED from 12/13/23 to 12/12/24. Ran test claim, Copay is $77.16- one month. This test claim was processed through [facility]- copay amounts may vary at other pharmacies due to pharmacy/plan contracts, or as the patient moves through the different stages of their insurance plan.   PA #/Case ID/Reference #: [id number]
